# Patient Record
Sex: Female | Born: 1960 | Race: White | Hispanic: No | Marital: Married | Smoking: Never smoker
Health system: Southern US, Community
[De-identification: ages and names within clinical notes are randomized; demographics above are authoritative.]

## PROBLEM LIST (undated history)

## (undated) DIAGNOSIS — R9389 Abnormal findings on diagnostic imaging of other specified body structures: Secondary | ICD-10-CM

## (undated) DIAGNOSIS — K649 Unspecified hemorrhoids: Secondary | ICD-10-CM

## (undated) DIAGNOSIS — Z8669 Personal history of other diseases of the nervous system and sense organs: Secondary | ICD-10-CM

## (undated) DIAGNOSIS — N200 Calculus of kidney: Secondary | ICD-10-CM

## (undated) DIAGNOSIS — F419 Anxiety disorder, unspecified: Secondary | ICD-10-CM

## (undated) DIAGNOSIS — N3010 Interstitial cystitis (chronic) without hematuria: Secondary | ICD-10-CM

## (undated) HISTORY — PX: TUBAL LIGATION: SHX77

## (undated) HISTORY — DX: Unspecified hemorrhoids: K64.9

## (undated) HISTORY — PX: OTHER SURGICAL HISTORY: SHX169

## (undated) HISTORY — DX: Personal history of other diseases of the nervous system and sense organs: Z86.69

## (undated) HISTORY — DX: Calculus of kidney: N20.0

## (undated) HISTORY — DX: Abnormal findings on diagnostic imaging of other specified body structures: R93.89

## (undated) HISTORY — DX: Anxiety disorder, unspecified: F41.9

## (undated) HISTORY — DX: Interstitial cystitis (chronic) without hematuria: N30.10

---

## 1993-07-03 ENCOUNTER — Encounter: Payer: Self-pay | Admitting: Internal Medicine

## 1994-04-15 ENCOUNTER — Encounter: Payer: Self-pay | Admitting: Internal Medicine

## 1998-03-27 ENCOUNTER — Encounter: Payer: Self-pay | Admitting: Internal Medicine

## 1998-03-28 ENCOUNTER — Encounter: Payer: Self-pay | Admitting: Internal Medicine

## 1998-03-29 ENCOUNTER — Encounter: Payer: Self-pay | Admitting: Internal Medicine

## 2002-06-16 ENCOUNTER — Encounter: Payer: Self-pay | Admitting: Internal Medicine

## 2005-12-06 ENCOUNTER — Encounter: Payer: Self-pay | Admitting: Internal Medicine

## 2006-06-20 ENCOUNTER — Emergency Department (HOSPITAL_COMMUNITY): Admission: EM | Admit: 2006-06-20 | Discharge: 2006-06-20 | Payer: Self-pay | Admitting: Emergency Medicine

## 2006-07-14 ENCOUNTER — Encounter: Admission: RE | Admit: 2006-07-14 | Discharge: 2006-07-14 | Payer: Self-pay | Admitting: Obstetrics and Gynecology

## 2006-12-16 ENCOUNTER — Emergency Department (HOSPITAL_COMMUNITY): Admission: EM | Admit: 2006-12-16 | Discharge: 2006-12-16 | Payer: Self-pay | Admitting: Emergency Medicine

## 2006-12-17 ENCOUNTER — Ambulatory Visit: Payer: Self-pay | Admitting: Family Medicine

## 2006-12-17 DIAGNOSIS — N3010 Interstitial cystitis (chronic) without hematuria: Secondary | ICD-10-CM

## 2006-12-17 DIAGNOSIS — Z8669 Personal history of other diseases of the nervous system and sense organs: Secondary | ICD-10-CM

## 2006-12-17 DIAGNOSIS — Z87442 Personal history of urinary calculi: Secondary | ICD-10-CM

## 2006-12-17 HISTORY — DX: Personal history of other diseases of the nervous system and sense organs: Z86.69

## 2006-12-19 ENCOUNTER — Telehealth (INDEPENDENT_AMBULATORY_CARE_PROVIDER_SITE_OTHER): Payer: Self-pay | Admitting: *Deleted

## 2007-05-20 ENCOUNTER — Ambulatory Visit: Payer: Self-pay | Admitting: Family Medicine

## 2007-05-20 ENCOUNTER — Telehealth (INDEPENDENT_AMBULATORY_CARE_PROVIDER_SITE_OTHER): Payer: Self-pay | Admitting: *Deleted

## 2008-09-12 ENCOUNTER — Ambulatory Visit: Payer: Self-pay | Admitting: Internal Medicine

## 2008-09-12 DIAGNOSIS — F419 Anxiety disorder, unspecified: Secondary | ICD-10-CM

## 2008-09-12 DIAGNOSIS — Z780 Asymptomatic menopausal state: Secondary | ICD-10-CM | POA: Insufficient documentation

## 2008-09-12 DIAGNOSIS — G4700 Insomnia, unspecified: Secondary | ICD-10-CM | POA: Insufficient documentation

## 2008-09-15 ENCOUNTER — Telehealth: Payer: Self-pay | Admitting: Internal Medicine

## 2008-09-20 ENCOUNTER — Ambulatory Visit: Payer: Self-pay | Admitting: Internal Medicine

## 2008-09-20 DIAGNOSIS — J069 Acute upper respiratory infection, unspecified: Secondary | ICD-10-CM | POA: Insufficient documentation

## 2008-10-26 ENCOUNTER — Ambulatory Visit: Payer: Self-pay | Admitting: Internal Medicine

## 2008-10-26 ENCOUNTER — Telehealth (INDEPENDENT_AMBULATORY_CARE_PROVIDER_SITE_OTHER): Payer: Self-pay | Admitting: *Deleted

## 2009-02-20 ENCOUNTER — Telehealth (INDEPENDENT_AMBULATORY_CARE_PROVIDER_SITE_OTHER): Payer: Self-pay | Admitting: *Deleted

## 2009-02-21 ENCOUNTER — Encounter: Admission: RE | Admit: 2009-02-21 | Discharge: 2009-02-21 | Payer: Self-pay | Admitting: Obstetrics and Gynecology

## 2009-02-21 ENCOUNTER — Encounter: Payer: Self-pay | Admitting: Internal Medicine

## 2009-02-28 ENCOUNTER — Ambulatory Visit: Payer: Self-pay | Admitting: Internal Medicine

## 2009-02-28 DIAGNOSIS — M549 Dorsalgia, unspecified: Secondary | ICD-10-CM | POA: Insufficient documentation

## 2009-02-28 LAB — CONVERTED CEMR LAB
Glucose, Urine, Semiquant: NEGATIVE
Urobilinogen, UA: 0.2

## 2009-03-02 ENCOUNTER — Encounter: Payer: Self-pay | Admitting: Internal Medicine

## 2009-03-02 LAB — CONVERTED CEMR LAB: RBC / HPF: NONE SEEN (ref <3)

## 2009-03-22 ENCOUNTER — Ambulatory Visit: Payer: Self-pay | Admitting: Internal Medicine

## 2009-06-20 ENCOUNTER — Encounter: Payer: Self-pay | Admitting: Internal Medicine

## 2009-06-20 LAB — CONVERTED CEMR LAB
AST: 19 units/L
BUN: 10 mg/dL
HCT: 38.9 %
Hemoglobin: 13.7 g/dL
Total Bilirubin: 0.5 mg/dL
Total Protein: 6.5 g/dL
platelet count: 175 10*3/uL

## 2009-06-21 ENCOUNTER — Ambulatory Visit: Payer: Self-pay | Admitting: Internal Medicine

## 2009-06-22 ENCOUNTER — Encounter: Payer: Self-pay | Admitting: Internal Medicine

## 2009-07-03 ENCOUNTER — Ambulatory Visit: Payer: Self-pay | Admitting: Internal Medicine

## 2009-07-13 ENCOUNTER — Ambulatory Visit: Payer: Self-pay | Admitting: Family Medicine

## 2009-07-14 ENCOUNTER — Encounter: Payer: Self-pay | Admitting: Family Medicine

## 2009-09-08 ENCOUNTER — Ambulatory Visit: Payer: Self-pay | Admitting: Internal Medicine

## 2009-09-11 ENCOUNTER — Ambulatory Visit: Payer: Self-pay | Admitting: Internal Medicine

## 2009-09-13 LAB — CONVERTED CEMR LAB: TSH: 0.99 microintl units/mL (ref 0.35–5.50)

## 2010-04-04 ENCOUNTER — Telehealth: Payer: Self-pay | Admitting: Internal Medicine

## 2010-05-04 ENCOUNTER — Ambulatory Visit: Payer: Self-pay | Admitting: Internal Medicine

## 2010-05-30 ENCOUNTER — Ambulatory Visit
Admission: RE | Admit: 2010-05-30 | Discharge: 2010-05-30 | Payer: Self-pay | Source: Home / Self Care | Attending: Internal Medicine | Admitting: Internal Medicine

## 2010-05-30 DIAGNOSIS — J329 Chronic sinusitis, unspecified: Secondary | ICD-10-CM | POA: Insufficient documentation

## 2010-05-30 LAB — CONVERTED CEMR LAB
Blood in Urine, dipstick: NEGATIVE
Nitrite: NEGATIVE
Specific Gravity, Urine: 1.015
WBC Urine, dipstick: NEGATIVE
pH: 7

## 2010-06-26 NOTE — Assessment & Plan Note (Signed)
Summary: sore throat and congestion//lch strep test//lch   Vital Signs:  Patient profile:   50 year old female Height:      67 inches Weight:      150.2 pounds BMI:     23.61 Pulse rate:   80 / minute BP sitting:   112 / 64  Vitals Entered By: rachel peeler CC: sore throat, head congestion, cough-yellow mucous, URI symptoms Comments "impossible to swallow" since tues AM, took tylenol and mucinexd today   History of Present Illness:       This is a 50 year old woman who presents with URI symptoms.  The symptoms began 2 days ago.  The severity is described as severe.  The patient complains of sore throat and dry cough, but denies nasal congestion, clear nasal discharge, purulent nasal discharge, productive cough, earache, and sick contacts.  The patient denies fever, low-grade fever (<100.5 degrees), fever of 100.5-103 degrees, fever of 103.1-104 degrees, fever to >104 degrees, stiff neck, dyspnea, wheezing, rash, vomiting, diarrhea, use of an antipyretic, and response to antipyretic.  The patient denies itchy watery eyes, itchy throat, sneezing, seasonal symptoms, response to antihistamine, headache, muscle aches, and severe fatigue.  Risk factors for Strep sinusitis include Strep exposure.  The patient denies the following risk factors for Strep sinusitis: unilateral facial pain, unilateral nasal discharge, poor response to decongestant, double sickening, tooth pain, tender adenopathy, and absence of cough.    Current Medications (verified): 1)  Ambien 10 Mg Tabs (Zolpidem Tartrate) .... One By Mouth At Bedtime As Needed 2)  Lexapro 10 Mg Tabs (Escitalopram Oxalate) .... Qd 3)  Epipen 2-Pak 0.3 Mg/0.33ml Devi (Epinephrine) .... As Directed 4)  Hrt Prescribed By The Gynecologist 5)  Epipen 2-Pak 0.3 Mg/0.35ml Devi (Epinephrine) .... Use One Injection As Needed For A Bee Sting, Then Proceed To The Er 6)  Ery-Tab 500 Mg Tbec (Erythromycin Base) .Marland Kitchen.. 1 By Mouth Two Times A Day 7)  Prednisone 10  Mg Tabs (Prednisone) .... 3 By Mouth Once Daily For 3 Days Then 2 By Mouth Once Daily For 3 Days Then 1 By Mouth Once Daily For 3 Days  Allergies (verified): 1)  ! Penicillin V Potassium (Penicillin V Potassium) 2)  ! * Bee Sting  Past History:  Past medical, surgical, family and social histories (including risk factors) reviewed for relevance to current acute and chronic problems.  Past Medical History: Reviewed history from 09/12/2008 and no changes required. Nephrolithiasis, hx of interstitial cystitits  Anxiety  Past Surgical History: Reviewed history from 09/12/2008 and no changes required. Kidney stone extraction Essure for birth Control endometrial ablation  Family History: Reviewed history from 09/12/2008 and no changes required. Lung cancer-- F DM-- GF MI-- F  Social History: Reviewed history from 09/12/2008 and no changes required. Married 1 child  moved to [place] 2008 from [place] [facility] Never Smoked Alcohol use-no Regular exercise-yes Drug use-no Occupation: Runner, broadcasting/film/video  Review of Systems      See HPI  Physical Exam  General:  Well-developed,well-nourished,in no acute distress; alert,appropriate and cooperative throughout examination Mouth:  pharyngeal erythema and pharyngeal exudate.   Neck:  cervical lymphadenopathy.   Lungs:  Normal respiratory effort, chest expands symmetrically. Lungs are clear to auscultation, no crackles or wheezes. Heart:  Normal rate and regular rhythm. S1 and S2 normal without gallop, murmur, click, rub or other extra sounds. Skin:  Intact without suspicious lesions or rashes Psych:  Oriented X3 and good eye contact.     Impression & Recommendations:  Problem # 1:  ACUTE PHARYNGITIS (ICD-462)  Her updated medication list for this problem includes:    Ery-tab 500 Mg Tbec (Erythromycin base) .Marland Kitchen... 1 by mouth two times a day    prednisone taper   Orders: T-Culture, Throat (29528-41324) Rapid Strep (40102) Admin of Therapeutic Inj   intramuscular or subcutaneous (72536) Depo- Medrol 80mg  (J1040)  Instructed to complete antibiotics and call if not improved in 48 hours.   Complete Medication List: 1)  Ambien 10 Mg Tabs (Zolpidem tartrate) .... One by mouth at bedtime as needed 2)  Lexapro 10 Mg Tabs (Escitalopram oxalate) .... Qd 3)  Epipen 2-pak 0.3 Mg/0.78ml Devi (Epinephrine) .... As directed 4)  Hrt Prescribed By The Gynecologist  5)  Epipen 2-pak 0.3 Mg/0.14ml Devi (Epinephrine) .... Use one injection as needed for a bee sting, then proceed to the er 6)  Ery-tab 500 Mg Tbec (Erythromycin base) .Marland Kitchen.. 1 by mouth two times a day 7)  Prednisone 10 Mg Tabs (Prednisone) .... 3 by mouth once daily for 3 days then 2 by mouth once daily for 3 days then 1 by mouth once daily for 3 days Prescriptions: PREDNISONE 10 MG TABS (PREDNISONE) 3 by mouth once daily for 3 days then 2 by mouth once daily for 3 days then 1 by mouth once daily for 3 days  #18 x 0   Entered and Authorized by:   Loreen Freud DO   Signed by:   Loreen Freud DO on 07/13/2009   Method used:   [facility] to        [facility] [phone]* (retail)       [address]       [place], [place]  [address]       Ph: [phone]       Fax: [phone]   RxID:   [id number] ERY-TAB 500 MG TBEC (ERYTHROMYCIN BASE) 1 by mouth two times a day  #20 x 0   Entered and Authorized by:   Loreen Freud DO   Signed by:   Loreen Freud DO on 07/13/2009   Method used:   [facility]ronically to        [facility] [phone]* (retail)       [address]       [place], [place]  [address]       Ph: [phone]       Fax: [phone]   RxID:   [id number]    Medication Administration  Injection # 1:    Medication: Depo- Medrol 80mg     Diagnosis: ACUTE PHARYNGITIS (ICD-462)    Route: IM    Site: RUOQ gluteus    Exp Date: 07/2010    Lot #: [id number]    Mfr: Pharmacia    Patient tolerated injection without  complications    Given by: Army Fossa CMA (July 13, 2009 4:07 PM)  Orders Added: 1)  T-Culture, Throat [[id number]] 2)  Est. Patient Level III [69678] 3)  Rapid Strep [93810] 4)  Admin of Therapeutic Inj  intramuscular or subcutaneous [96372] 5)  Depo- Medrol 80mg  [J1040]

## 2010-06-26 NOTE — Assessment & Plan Note (Signed)
Summary: 10 day fu/ns/kdc   Vital Signs:  Patient profile:   50 year old female Height:      67 inches Weight:      148.2 pounds BMI:     23.30 Pulse rate:   70 / minute BP sitting:   110 / 60  Vitals Entered By: Shary Decamp (July 03, 2009 4:16 PM) CC: rov - feeling much better   History of Present Illness: follow-up from last office visit feeling well abdominal pain resolved within a few days she's not on Nexium anymore denies nausea, vomiting, diarrhea   Current Medications (verified): 1)  Ambien 10 Mg Tabs (Zolpidem Tartrate) .... One By Mouth At Bedtime As Needed 2)  Lexapro 10 Mg Tabs (Escitalopram Oxalate) .... Qd 3)  Epipen 2-Pak 0.3 Mg/0.30ml Devi (Epinephrine) .... As Directed 4)  Hrt Prescribed By The Gynecologist 5)  Epipen 2-Pak 0.3 Mg/0.36ml Devi (Epinephrine) .... Use One Injection As Needed For A Bee Sting, Then Proceed To The Er  Allergies (verified): 1)  ! Penicillin V Potassium (Penicillin V Potassium) 2)  ! * Bee Sting  Past History:  Past Medical History: Reviewed history from 09/12/2008 and no changes required. Nephrolithiasis, hx of interstitial cystitits  Anxiety  Past Surgical History: Reviewed history from 09/12/2008 and no changes required. Kidney stone extraction Essure for birth Control endometrial ablation  Physical Exam  General:  alert and well-developed.   Abdomen:  soft, non-tender, no distention, no masses, no guarding, and no rigidity.     Impression & Recommendations:  Problem # 1:  ABDOMINAL PAIN (ICD-789.00) resolved did she pass a gallbladder stone?   Plan: Patient will call if symptoms resurface  Complete Medication List: 1)  Ambien 10 Mg Tabs (Zolpidem tartrate) .... One by mouth at bedtime as needed 2)  Lexapro 10 Mg Tabs (Escitalopram oxalate) .... Qd 3)  Epipen 2-pak 0.3 Mg/0.10ml Devi (Epinephrine) .... As directed 4)  Hrt Prescribed By The Gynecologist  5)  Epipen 2-pak 0.3 Mg/0.67ml Devi (Epinephrine)  .... Use one injection as needed for a bee sting, then proceed to the er

## 2010-06-26 NOTE — Miscellaneous (Signed)
Summary: RESULTS FROM [facility]  Clinical Lists Changes  Observations: Added new observation of EKG INTERP: normal:  done @ [facility] (06/20/2009 11:46) Added new observation of PROTEIN, TOT: 6.5 g/dL (16/02/9603 54:09) Added new observation of ALBUMIN: 3.7 g/dL (81/19/1478 29:56) Added new observation of BILI TOTAL: 0.5 mg/dL (21/30/8657 84:69) Added new observation of ALK PHOS: 71 units/L (06/20/2009 11:46) Added new observation of SGPT (ALT): 11 units/L (06/20/2009 11:46) Added new observation of SGOT (AST): 19 units/L (06/20/2009 11:46) Added new observation of BG RANDOM: 104 mg/dL (62/95/2841 32:44) Added new observation of CREATININE: 0.67 mg/dL (05/29/7251 66:44) Added new observation of BUN: 10 mg/dL (03/47/4259 56:38) Added new observation of CO2 TOTAL: 26 mmol/L (06/20/2009 11:46) Added new observation of CHLORIDE: 105 mmol/L (06/20/2009 11:46) Added new observation of POTASSIUM: 3.6 mmol/L (06/20/2009 11:46) Added new observation of SODIUM: 139 mmol/L (06/20/2009 11:46) Added new observation of PLATELET CNT: 175 10*3/microliter (06/20/2009 11:46) Added new observation of HCT: 38.9 % (06/20/2009 11:46) Added new observation of HGB: 13.7 g/dL (75/64/3329 51:88) Added new observation of RBC: 4.26 10*6/mm3 (06/20/2009 11:46) Added new observation of WBC: 5.3 10*3/mm3 (06/20/2009 11:46) Added new observation of [place] GALL/LIV: normal:  done [facility]  (06/20/2009 11:46) Added new observation of CTABDPELVIS: normal:  done @ [facility]  (06/20/2009 11:46)      CT Abdomen/Pelvis  Procedure date:  06/20/2009  Findings:      normal:  done @ [facility]  [place] of gallbladder-liver  Procedure date:  06/20/2009  Findings:      normal:  done @ [facility]  EKG  Procedure date:  06/20/2009  Findings:      normal:  done @ [facility]   CT Abdomen/Pelvis  Procedure date:  06/20/2009  Findings:      normal:  done @ [facility]  [place] of gallbladder-liver  Procedure date:   06/20/2009  Findings:      normal:  done @ [facility]  EKG  Procedure date:  06/20/2009  Findings:      normal:  done @ [facility]    Complete Blood Count Test Date: 06/20/2009             Value   Units      H/L    Reference  WBC:       5.3   X 10^3/uL          (3.5-10.0) RBC:       4.26  X 10^6/uL          (3.60-5.00) Hgb:       13.7  g/dl               (41.6-60.6) Hct:       38.9  %                  (35.0-46.0) Platelets: 175   X 10^3/uL          (150-450)  Test performed by:  [facility]    Chemistry Labs Test Date: 06/20/2009                      Value Units        H/L   Reference  Sodium:             139   mmol/L             (137-145) Potassium:  3.6   mmol/L             (3.6-5.0) Chloride:           105   mmol/L             (101-111) CO2:                26    mmol/L             (22-31) BUN:                10    mg/dL              (1-61) Creatinine:         0.67  mg/dL              (0.9-6.0) Glucose-random:     104   mg/dL              (45-409) SGOT:               19    U/L                (10-40) SGPT:               11    U/L                (10-40) Alkaline P'tase:    71    U/L                (10-120) T. Bili:            0.5   mg/dL              (8.1-1.9) Albumin:            3.7   g/dL               (3-5) Total Protein:      6.5   g/dL               (4-7)  Test performed by:  [facility]    Lab Entry Test Date: 06/20/2009                        Value        Units        H/L   Reference  Lipase (ser):         25           U/L                (7-60)  Test performed by:    [facility]

## 2010-06-26 NOTE — Letter (Signed)
Summary: Notes Dated 03-08-93 thru 12-06-05/[facility]  Notes Dated 03-08-93 thru 12-06-05/[facility]   Imported By: Lanelle Bal 06/29/2009 14:19:09  _____________________________________________________________________  External Attachment:    Type:   Image     Comment:   External Document  Appended Document: Notes Dated 03-08-93 thru 12-06-05/[facility] scanned for future reference

## 2010-06-26 NOTE — Miscellaneous (Signed)
Summary: several years old  records  these are several years old records [name] MD  June 23, 2009 8:14 AM Clinical Lists Changes  Observations: Added new observation of ETTECHOCOMM: 1. resting ECG was abnl 2. excerise tolerance was excellent 3. hemodynamic response was adequate 4. exercise induced no cardiac sxs 5. stress ecg findings were non-diagnostic 6. stress myocadial perfusion imaging sonsistent with normal perfusion (06/16/2002 13:09) Added new observation of ETTECHOFIND: Normal-no evidence of ischemia.   (06/16/2002 13:09) Added new observation of ABDOM US: normal:   (03/27/1998 13:10)      Stress Echocardiogram  Procedure date:  06/16/2002  Findings:      Normal-no evidence of ischemia.    Comments:      1. resting ECG was abnl 2. excerise tolerance was excellent 3. hemodynamic response was adequate 4. exercise induced no cardiac sxs 5. stress ecg findings were non-diagnostic 6. stress myocadial perfusion imaging sonsistent with normal perfusion  [place] of Abdomen  Procedure date:  03/27/1998  Findings:      normal:     Stress Echocardiogram  Procedure date:  06/16/2002  Findings:      Normal-no evidence of ischemia.    Comments:      1. resting ECG was abnl 2. excerise tolerance was excellent 3. hemodynamic response was adequate 4. exercise induced no cardiac sxs 5. stress ecg findings were non-diagnostic 6. stress myocadial perfusion imaging sonsistent with normal perfusion  [place] of Abdomen  Procedure date:  03/27/1998  Findings:      normal:

## 2010-06-26 NOTE — Progress Notes (Signed)
Summary: Refill Request  Phone Note Refill Request Call back at [phone] Message from:  Pharmacy on April 04, 2010 10:38 AM  Refills Requested: Medication #1:  AMBIEN 10 MG TABS one by mouth at bedtime as needed   Dosage confirmed as above?Dosage Confirmed   Brand Name Necessary? No   Supply Requested: 3 months   Last Refilled: 11/03/2008   Notes[facility]  Next Appointment Scheduled: none Initial call taken by: Harold Barban,  April 04, 2010 10:38 AM  Follow-up for Phone Call        ok 90 and 1 RF [name] MD  April 04, 2010 1:41 PM     Prescriptions: AMBIEN 10 MG TABS (ZOLPIDEM TARTRATE) one by mouth at bedtime as needed  #90 x 1   Entered by:   Army Fossa CMA   Authorized by:   Nolon Rod. Paz MD   Signed by:   Army Fossa CMA on 04/04/2010   Method used:   Printed then faxed to ...       [facility] [phone]* (retail)       [address]       [place], [place]  [address]       Ph: [phone]       Fax: [phone]   RxID:   [id number]

## 2010-06-26 NOTE — Assessment & Plan Note (Signed)
Summary: 6 MTH FU/KDC//pt rsh//lch   Vital Signs:  Patient profile:   50 year old female Height:      67 inches Weight:      149 pounds BMI:     23.42 Pulse rate:   72 / minute BP sitting:   112 / 60  Vitals Entered By: Shary Decamp (September 08, 2009 4:29 PM) CC: rov   History of Present Illness: ROV to discuss the need to continue Lexapro she is feeling well still has a lot of stress in her life related to her husband's new job and  her own job  Current Medications (verified): 1)  Ambien 10 Mg Tabs (Zolpidem Tartrate) .... One By Mouth At Bedtime As Needed 2)  Lexapro 10 Mg Tabs (Escitalopram Oxalate) .... Qd 3)  Epipen 2-Pak 0.3 Mg/0.94ml Devi (Epinephrine) .... As Directed 4)  Hrt Prescribed By The Gynecologist 5)  Epipen 2-Pak 0.3 Mg/0.53ml Devi (Epinephrine) .... Use One Injection As Needed For A Bee Sting, Then Proceed To The Er 6)  Prometrium 7)  Estrodial  Allergies (verified): 1)  ! Penicillin V Potassium (Penicillin V Potassium) 2)  ! * Bee Sting  Past History:  Past Medical History: Nephrolithiasis, hx of interstitial cystitits  Anxiety  Social History: Married 1 child  moved to [place] 2008 from [facility] Never Smoked Alcohol use-no Regular exercise-yes Drug use-no Occupation: Runner, broadcasting/film/video, private school  Review of Systems       the only new development is that she started to experiencing  severe hot flashes for the last two weeks, menopausal symptoms were previously well controlled on HRT and SSRIs denies fever, chest pain, shortness of breath she was also seen here several months ago after a  ER visit due to abdominal pain.  No further symptoms  Physical Exam  General:  alert and well-developed.   Lungs:  normal respiratory effort, no intercostal retractions, no accessory muscle use, and normal breath sounds.     Impression & Recommendations:  Problem # 1:  ANXIETY (ICD-300.00) overall well controlled from the anxiety standpoint, she probably could be  able to discontinue Lexapro however I suspect the Lexapro is also helping her menopausal symptoms; since  her menopausal symptoms are slightly worse lately, I don't think this is a right time to D/C Lexapro plan: Continue Lexapro, reassess in 6 months  Her updated medication list for this problem includes:    Lexapro 10 Mg Tabs (Escitalopram oxalate) ..... Qd  Problem # 2:  HOT FLASHES (ICD-627.2) increased for the last two weeks recommend to call gynecology, increase HRT doses?  Complete Medication List: 1)  Ambien 10 Mg Tabs (Zolpidem tartrate) .... One by mouth at bedtime as needed 2)  Lexapro 10 Mg Tabs (Escitalopram oxalate) .... Qd 3)  Epipen 2-pak 0.3 Mg/0.25ml Devi (Epinephrine) .... As directed 4)  Hrt Prescribed By The Gynecologist  5)  Epipen 2-pak 0.3 Mg/0.59ml Devi (Epinephrine) .... Use one injection as needed for a bee sting, then proceed to the er 6)  Prometrium  7)  Estrodial   Patient Instructions: 1)  please check a TSH next week  2)  dx 300.00  3)  Please schedule a follow-up appointment in 6 months .

## 2010-06-26 NOTE — Assessment & Plan Note (Signed)
Summary: er/fu/kdc   Vital Signs:  Patient profile:   50 year old female Height:      67 inches Weight:      150.4 pounds Temp:     98.5 degrees F BP sitting:   110 / 80  Vitals Entered By: Shary Decamp (June 21, 2009 3:48 PM) CC: F/U FROM ED VISIT YESTERDAY ([facility]) Comments  - went to ED with back, abd pain  - CT, EKG, u/s, labs neg  - was rx'd flexeril, motrin, vicodin 5/500 Shary Decamp  June 21, 2009 3:49 PM    History of Present Illness: sudden onset of intense epigastric pain yesterday two hours after a light breakfast pain is described  as a punch, it  definitely radiated straight to her back. Went to the ER and reportedly all   blood work and x-rays were negative. was rx'd flexeril, motrin, vicodin 5/500 her pain is different from previous problems with urolithiasis  knee, the abdominal pain is essentially resolved, she has been left with moderate back soreness.  This pain is worse whenever she bends or twists her torso. Is not recall any back injury recently except for the fact that she had to "bend funny" yesterday to get to her son's sport car  Current Medications (verified): 1)  Ambien 10 Mg Tabs (Zolpidem Tartrate) .... One By Mouth At Bedtime As Needed 2)  Lexapro 10 Mg Tabs (Escitalopram Oxalate) .... Qd 3)  Epipen 2-Pak 0.3 Mg/0.13ml Devi (Epinephrine) .... As Directed 4)  Hrt Prescribed By The Gynecologist 5)  Epipen 2-Pak 0.3 Mg/0.29ml Devi (Epinephrine) .... Use One Injection As Needed For A Bee Sting, Then Proceed To The Er  Allergies (verified): 1)  ! Penicillin V Potassium (Penicillin V Potassium) 2)  ! * Bee Sting  Past History:  Past Medical History: Reviewed history from 09/12/2008 and no changes required. Nephrolithiasis, hx of interstitial cystitits  Anxiety  Past Surgical History: Reviewed history from 09/12/2008 and no changes required. Kidney stone extraction Essure for birth Control endometrial ablation  Social  History: Reviewed history from 09/12/2008 and no changes required. Married 1 child  moved to [place] 2008 from [facility] Never Smoked Alcohol use-no Regular exercise-yes Drug use-no Occupation: Runner, broadcasting/film/video  Review of Systems       denies any fever nausea, vomiting x1  yesterday, no hematemesis no  diarrhea, blood in the stools, no recent heartburn no recent intake of over-the-counter NSAIDs chronic dysuria at baseline, no hematuria no rash in the back  Physical Exam  General:  alert, well-developed, and well-nourished.   Eyes:  not pale,  icteric Lungs:  normal respiratory effort, no intercostal retractions, no accessory muscle use, and normal breath sounds.   Heart:  normal rate, regular rhythm, no murmur, and no gallop.   Abdomen:  soft, no distention, and no masses.  generalized mild abdominal  discomfort, the patient become quite nauseous and vomited after my exam Msk:  has generalize dyscomfort  to palpation at the CVA bilaterally but no tenderness over the spine per se   Impression & Recommendations:  Problem # 1:  BACK PAIN (ICD-724.5) abdomen and back pain, etiology unclear PUD? musculoskeletal back pain? Dyskinetic gallbladder? Plan: avoid  Motrin samples of nexium continue with Vicodin get results from the ER call immediately if symptoms increase reassess in 10 days  Problem # 2:  addendum   records from the ER: CBC showed a norma WBC  lipase normal, LFTs normal, UA essentially unremarkable, noncontrast CT of the abdomen  and is normal, Right upper quadrant ultrasound normal plan: The same  Problem # 3:  ABDOMINAL PAIN (ICD-789.00) see #1  Complete Medication List: 1)  Ambien 10 Mg Tabs (Zolpidem tartrate) .... One by mouth at bedtime as needed 2)  Lexapro 10 Mg Tabs (Escitalopram oxalate) .... Qd 3)  Epipen 2-pak 0.3 Mg/0.53ml Devi (Epinephrine) .... As directed 4)  Hrt Prescribed By The Gynecologist  5)  Epipen 2-pak 0.3 Mg/0.44ml Devi (Epinephrine) .... Use one  injection as needed for a bee sting, then proceed to the er  Patient Instructions: 1)  rest, flexeril, vicodin 2)  avoid motrin, advil 3)  nexium two times a day on a empty stomach 4)  came back in 10 days 5)  call any timeif symptoms severe (or go to the ER)

## 2010-06-28 NOTE — Assessment & Plan Note (Signed)
Summary: cough - congested/cbs   Vital Signs:  Patient profile:   50 year old female Weight:      153.25 pounds O2 Sat:      98 % on Room air Temp:     98.8 degrees F oral Pulse rate:   94 / minute Pulse rhythm:   regular BP sitting:   132 / 82  (left arm) Cuff size:   large  Vitals Entered By: Army Fossa CMA (May 04, 2010 4:14 PM)  O2 Flow:  Room air CC: Pt here chest congestion- started monday Comments tried mucinex  [facility] [place]   History of Present Illness: symptoms started 4 days ago  Sinus congestion, teeth were hurting, then she developed sore throat and chest congestion. She's coughing up some discolored phlegm. Teeth pain has decreased husband is sick with similar symptoms  Current Medications (verified): 1)  Ambien 10 Mg Tabs (Zolpidem Tartrate) .... One By Mouth At Bedtime As Needed 2)  Lexapro 10 Mg Tabs (Escitalopram Oxalate) .... Qd 3)  Epipen 2-Pak 0.3 Mg/0.51ml Devi (Epinephrine) .... As Directed 4)  Epipen 2-Pak 0.3 Mg/0.29ml Devi (Epinephrine) .... Use One Injection As Needed For A Bee Sting, Then Proceed To The Er 5)  Prometrium 6)  Estrodial  Allergies (verified): 1)  ! Penicillin V Potassium (Penicillin V Potassium) 2)  ! * Bee Sting  Past History:  Past Medical History: Reviewed history from 09/08/2009 and no changes required. Nephrolithiasis, hx of interstitial cystitits  Anxiety  Past Surgical History: Reviewed history from 09/12/2008 and no changes required. Kidney stone extraction Essure for birth Control endometrial ablation  Social History: Married 1 child  moved to [place] 2008 from [facility] Never Smoked Alcohol use-no Regular exercise-yes Drug use-no Occupation: Runner, broadcasting/film/video, private school, 5th grade  Review of Systems General:  Complains of fatigue; (+) subjective fever . ENT:  had HA 2 days ago. Resp:  Denies coughing up blood. GI:  Denies diarrhea and vomiting; some nausea . MS:  some myalhias . Derm:  Denies  rash.  Physical Exam  General:  alert and well-developed.  nontoxic Head:  face symmetric Mild symmetric tenderness in the maxillary sinuses Ears:  R ear normal and L ear normal.   Nose:  slightly congested Mouth:  no redness or discharge Lungs:  normal respiratory effort, no intercostal retractions, no accessory muscle use, and normal breath sounds.   Heart:  normal rate, regular rhythm, and no murmur.     Impression & Recommendations:  Problem # 1:  URI (ICD-465.9) Assessment New presents with URI symptoms, some tenderness in the maxillary sinuses. see  instructions  Complete Medication List: 1)  Ambien 10 Mg Tabs (Zolpidem tartrate) .... One by mouth at bedtime as needed 2)  Lexapro 10 Mg Tabs (Escitalopram oxalate) .... Qd 3)  Epipen 2-pak 0.3 Mg/0.16ml Devi (Epinephrine) .... As directed 4)  Epipen 2-pak 0.3 Mg/0.40ml Devi (Epinephrine) .... Use one injection as needed for a bee sting, then proceed to the er 5)  Prometrium  6)  Estrodial  7)  Zithromax Z-pak 250 Mg Tabs (Azithromycin) .... As directed  Patient Instructions: 1)  rest, fluids , tylenol 2)  mucinex DM as needed 3)  sudafed 30mg   (behind the counter ) 1 every 6 hours as needed for congestion 4)  if no better in few days , start a Zpack 5)  call if you get worse or if no better next week  Prescriptions: ZITHROMAX Z-PAK 250 MG TABS (AZITHROMYCIN) as directed  #1  x 0   Entered and Authorized by:   Nolon Rod. Areej Tayler MD   Signed by:   Nolon Rod. Zayne Draheim MD on 05/04/2010   Method used:   Print then Give to Patient   RxID:   [id number]    Orders Added: 1)  Est. Patient Level III [65784]

## 2010-06-28 NOTE — Assessment & Plan Note (Signed)
Summary: sore throat//congestion//lch   Vital Signs:  Patient profile:   50 year old female Weight:      152.25 pounds Temp:     98.4 degrees F oral Pulse rate:   82 / minute Pulse rhythm:   regular BP sitting:   117 / 70  (left arm) Cuff size:   large  Vitals Entered By: Army Fossa CMA (May 30, 2010 4:03 PM) CC: Pt here c/o Chest Congestion, HA, Sinus Congestion, Ear Pain,Fatiuged, urinating more. Comments [facility] [address] [address]   History of Present Illness: she was seen 05-04-10 with a URI, few days later she started a Z-Pak. She actually never got 100% better In the last few days the symptoms have increased. She has persistent cough  (although slightly better than initially) Continue with sinus and ear congestion Now also has chest congestion  ROS No fevers Continue with sore throat No nausea - vomiting No wheezing  Current Medications (verified): 1)  Ambien 10 Mg Tabs (Zolpidem Tartrate) .... One By Mouth At Bedtime As Needed 2)  Lexapro 10 Mg Tabs (Escitalopram Oxalate) .... Qd 3)  Epipen 2-Pak 0.3 Mg/0.32ml Devi (Epinephrine) .... As Directed 4)  Epipen 2-Pak 0.3 Mg/0.75ml Devi (Epinephrine) .... Use One Injection As Needed For A Bee Sting, Then Proceed To The Er 5)  Prometrium 6)  Estrodial  Allergies (verified): 1)  ! Penicillin V Potassium (Penicillin V Potassium) 2)  ! * Bee Sting  Past History:  Past Medical History: Reviewed history from 09/08/2009 and no changes required. Nephrolithiasis, hx of interstitial cystitits  Anxiety  Past Surgical History: Reviewed history from 09/12/2008 and no changes required. Kidney stone extraction Essure for birth Control endometrial ablation  Physical Exam  General:  alert and well-developed.  no acutely ill Head:  face symmetric, slightly tender at the bilateral maxillary sinuses. Ears:  R ear normal and L ear normal.   Nose:  slightly congested Mouth:  no redness or discharge Lungs:  normal respiratory  effort, no intercostal retractions, no accessory muscle use, and normal breath sounds.   Heart:  normal rate, regular rhythm, and no murmur.     Impression & Recommendations:  Problem # 1:  SINUSITIS (ICD-473.9) ongoing symptoms for  ~ 3 weeks. she has sinus congestion and now some tenderness in the cheeks ----> Suspect sinusitis She is allergic to penicillin, will prescribe Bactrim. see  instructions If no better, consider ENT referral versus a CT of the sinuses Her updated medication list for this problem includes:    Bactrim Ds 800-160 Mg Tabs (Sulfamethoxazole-trimethoprim) .Marland Kitchen... 1 by mouth two times a day x 10 days    Flonase 50 Mcg/act Susp (Fluticasone propionate) .Marland Kitchen... 2 sprays on each side of the nose once daily  Complete Medication List: 1)  Ambien 10 Mg Tabs (Zolpidem tartrate) .... One by mouth at bedtime as needed 2)  Lexapro 10 Mg Tabs (Escitalopram oxalate) .... Qd 3)  Epipen 2-pak 0.3 Mg/0.26ml Devi (Epinephrine) .... As directed 4)  Epipen 2-pak 0.3 Mg/0.35ml Devi (Epinephrine) .... Use one injection as needed for a bee sting, then proceed to the er 5)  Prometrium  6)  Estrodial  7)  Bactrim Ds 800-160 Mg Tabs (Sulfamethoxazole-trimethoprim) .Marland Kitchen.. 1 by mouth two times a day x 10 days 8)  Prednisone 10 Mg Tabs (Prednisone) .... 4 by mouth once daily x 2, 3x2,2x2,1x2 9)  Flonase 50 Mcg/act Susp (Fluticasone propionate) .... 2 sprays on each side of the nose once daily  Other Orders: UA  Dipstick w/o Micro (automated)  (81003)  Patient Instructions: 1)  mucinex DM two times a day x 1 week, then as needed  2)  Bactrim, antibiotic  1 by mouth two times a day x 10 days 3)  Prednisone 10 Mg Tabs :  4 by mouth once daily x 2, 3x2,2x2,1x2 4)  Flonase : 2 sprays on each side of the nose once daily 5)  call if no better in 2 weeks  Prescriptions: FLONASE 50 MCG/ACT SUSP (FLUTICASONE PROPIONATE) 2 sprays on each side of the nose once daily  #1 x 1   Entered and Authorized by:    Elita Quick E. Khambrel Amsden MD   Signed by:   Nolon Rod. Shyvonne Chastang MD on 05/30/2010   Method used:   [facility]        [phone][facility] (retail[facility]       [address]       [place], [place]  [address]       Ph: [phone]       Fax: [phone]   RxID:   [id number] PREDNISONE 10 MG TABS (PREDNISONE) 4 by mouth once daily x 2, 3x2,2x2,1x2  #20 x 0   Entered and Authorized by:   Nolon Rod. Meko Masterson MD   Signed by:   Nolon Rod. Olesya Wike MD on 05/30/2010   Method used:   [facility] to        [facility] [phone][facility] (retail)       [address]       [place], [place]  [address]       Ph: [phone]       Fax: [phone]   RxID:   [id number] BACTRIM DS 800-160 MG TABS (SULFAMETHOXAZOLE-TRIMETHOPRIM) 1 by mouth two times a day x 10 days  #20 x 0   Entered and Authorized by:   Nolon Rod. Ariella Voit MD   Signed by:   Nolon Rod. Naeem Quillin MD on 05/30/2010   Method used:   [facility]ically to        [facility] [phone]* (retail)       [address]       [place], [place]  [address]       Ph: [phone]       Fax: [phone]   RxID:   [id number]    Orders Added: 1)  UA Dipstick w/o Micro (automated)  [81003] 2)  Est. Patient Level III [78938]    Laboratory Results   Urine Tests    Routine Urinalysis   Color: yellow Appearance: Clear Glucose: negative   (Normal Range: Negative) Bilirubin: negative   (Normal Range: Negative) Ketone: negative   (Normal Range: Negative) Spec. Gravity: 1.015   (Normal Range: 1.003-1.035) Blood: negative   (Normal Range: Negative) pH: 7.0   (Normal Range: 5.0-8.0) Protein: negative   (Normal Range: Negative) Urobilinogen: 0.2   (Normal Range: 0-1) Nitrite: negative   (Normal Range: Negative) Leukocyte Esterace: negative   (Normal Range: Negative)    Comments: Army Fossa CMA  May 30, 2010 4:08 PM

## 2010-11-06 ENCOUNTER — Ambulatory Visit (INDEPENDENT_AMBULATORY_CARE_PROVIDER_SITE_OTHER): Payer: BC Managed Care – PPO | Admitting: Internal Medicine

## 2010-11-06 ENCOUNTER — Encounter: Payer: Self-pay | Admitting: Internal Medicine

## 2010-11-06 DIAGNOSIS — F411 Generalized anxiety disorder: Secondary | ICD-10-CM

## 2010-11-06 NOTE — Progress Notes (Signed)
  Subjective:    Patient ID: Annette Woods, female    DOB: Sep 15, 1960, 50 y.o.   MRN: [id number]  HPI Likes to discuss discontinuation of Lexapro  Past Medical History  Diagnosis Date  . Nephrolithiasis   . Interstitial cystitis   . Anxiety    Past Surgical History  Procedure Date  . Kidney stone extraction   . Endometiral ablation      Review of Systems Doing well, currently not having any issues with anxiety, depression or even insomnia. Is not taking Ambien. On looking back, she has taken Lexapro for about 2 years, initially anxiety was triggered by her menopause and the fact that her husband was not working. Currently on HRT and doing well.     Objective:   Physical Exam Alert, oriented x3. No apparent distress. No anxious or depressed appearing.       Assessment & Plan:  Today , I spent more than 15  min with the patient, >50% of the time counseling, and reviewing the chart

## 2010-11-06 NOTE — Assessment & Plan Note (Signed)
Has used Lexapro for almost 2 years, initial triggers of anxiety were menopause and the fact that her husband was not working. Currently menopause is better, on HRT as prescribed by gynecology. Her husband is back working. I think his appropriate to discontinued Lexapro, we'll do it slowly, see instructions. Patient could go back on Lexapro if needed. Will call if problems

## 2010-11-06 NOTE — Patient Instructions (Signed)
Lexapro 10 mg: 1/2 tablet a day x 1 month Then 1/2 tablet every other day x 1 month, then stop

## 2010-11-08 ENCOUNTER — Other Ambulatory Visit: Payer: Self-pay | Admitting: *Deleted

## 2010-11-08 MED ORDER — ESCITALOPRAM OXALATE 10 MG PO TABS: 10.0000 mg | ORAL_TABLET | ORAL | Status: DC

## 2011-01-21 ENCOUNTER — Telehealth: Payer: Self-pay | Admitting: *Deleted

## 2011-01-21 NOTE — Telephone Encounter (Signed)
Pt says that she had recently stopped Lexapro and now would like to start back on medication says that her hot flashes have multiplied since stopping and would like to be restarted. Pls advise.

## 2011-01-22 MED ORDER — ESCITALOPRAM OXALATE 10 MG PO TABS: 10.0000 mg | ORAL_TABLET | ORAL | Status: DC

## 2011-01-22 NOTE — Telephone Encounter (Signed)
Left detailed msg on voicemail that rx sent to pharmacy

## 2011-01-22 NOTE — Telephone Encounter (Signed)
Okay to go back to Lexapro 10 mg one by mouth daily, call a prescription, #30 and 6 rf

## 2011-03-11 LAB — URINALYSIS, ROUTINE W REFLEX MICROSCOPIC
Bilirubin Urine: NEGATIVE
Glucose, UA: NEGATIVE
Hgb urine dipstick: NEGATIVE
Ketones, ur: NEGATIVE
Nitrite: NEGATIVE
Protein, ur: NEGATIVE
Specific Gravity, Urine: 1.023
Urobilinogen, UA: 1
pH: 6

## 2011-03-11 LAB — COMPREHENSIVE METABOLIC PANEL
ALT: 13
AST: 17
Albumin: 3.7
Alkaline Phosphatase: 66
BUN: 9
CO2: 27
Calcium: 9
Chloride: 105
Creatinine, Ser: 0.61
GFR calc Af Amer: >60
GFR calc non Af Amer: >60
Glucose, Bld: 125 — ABNORMAL HIGH
Potassium: 3.4 — ABNORMAL LOW
Sodium: 138
Total Bilirubin: 0.7
Total Protein: 6.6

## 2011-03-11 LAB — DIFFERENTIAL
Basophils Absolute: 0
Basophils Relative: 0
Eosinophils Absolute: 0
Eosinophils Relative: 1
Lymphocytes Relative: 12
Lymphs Abs: 1.2
Monocytes Absolute: 0.6
Monocytes Relative: 6
Neutro Abs: 7.8 — ABNORMAL HIGH
Neutrophils Relative %: 80 — ABNORMAL HIGH

## 2011-03-11 LAB — GC/CHLAMYDIA PROBE AMP, GENITAL: GC Probe Amp, Genital: NEGATIVE

## 2011-03-11 LAB — CBC
HCT: 40.4
Hemoglobin: 13.9
MCHC: 34.5
MCV: 89.8
Platelets: 235
RBC: 4.5
RDW: 13
WBC: 9.7

## 2011-03-11 LAB — PREGNANCY, URINE: Preg Test, Ur: NEGATIVE

## 2011-03-11 LAB — LIPASE, BLOOD: Lipase: 19

## 2011-07-04 ENCOUNTER — Ambulatory Visit (INDEPENDENT_AMBULATORY_CARE_PROVIDER_SITE_OTHER): Payer: BC Managed Care – PPO | Admitting: Family Medicine

## 2011-07-04 ENCOUNTER — Encounter: Payer: Self-pay | Admitting: Family Medicine

## 2011-07-04 VITALS — BP 118/75 | HR 76 | Temp 98.4°F | Ht 66.5 in | Wt 155.0 lb

## 2011-07-04 DIAGNOSIS — J329 Chronic sinusitis, unspecified: Secondary | ICD-10-CM | POA: Insufficient documentation

## 2011-07-04 MED ORDER — DOXYCYCLINE HYCLATE 100 MG PO TABS: 100.0000 mg | ORAL_TABLET | Freq: Two times a day (BID) | ORAL | Status: AC

## 2011-07-04 NOTE — Patient Instructions (Signed)
This is a sinus infection Start the Doxycycline as directed Drink plenty of fluids REST! Alternate tylenol and ibuprofen every 4 hrs for pain or fever Delsym as needed for cough Call with any questions or concerns Hang in there!!

## 2011-07-04 NOTE — Progress Notes (Signed)
  Subjective:    Patient ID: Annette Woods, female    DOB: 1960-09-08, 51 y.o.   MRN: [id number]  HPI URI- sore throat started 'a couple of days ago'.  + nasal congestion, sinus pressure, bilateral ear fullness, aches.  No fevers- alternating chills, sweats.  + cough- typically nonproductive.  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  + skin tenderness 'it hurts to touch my hair'.   Review of Systems For ROS see HPI     Objective:   Physical Exam  Vitals reviewed. Constitutional: She appears well-developed and well-nourished. No distress.  HENT:  Head: Normocephalic and atraumatic.  Right Ear: Tympanic membrane normal.  Left Ear: Tympanic membrane normal.  Nose: Mucosal edema and rhinorrhea present. Right sinus exhibits maxillary sinus tenderness and frontal sinus tenderness. Left sinus exhibits maxillary sinus tenderness and frontal sinus tenderness.  Mouth/Throat: Uvula is midline and mucous membranes are normal. Posterior oropharyngeal erythema present. No oropharyngeal exudate.  Eyes: Conjunctivae and EOM are normal. Pupils are equal, round, and reactive to light.  Neck: Normal range of motion. Neck supple.  Cardiovascular: Normal rate, regular rhythm and normal heart sounds.   Pulmonary/Chest: Effort normal and breath sounds normal. No respiratory distress. She has no wheezes.  Lymphadenopathy:    She has no cervical adenopathy.  Skin: Skin is warm and dry.          Assessment & Plan:

## 2011-07-07 NOTE — Assessment & Plan Note (Signed)
New.  sxs and PE consistent w/ infxn.  Start abx.  Reviewed supportive care and red flags that should prompt return.  Pt expressed understanding and is in agreement w/ plan.  

## 2011-08-12 ENCOUNTER — Other Ambulatory Visit: Payer: Self-pay | Admitting: Internal Medicine

## 2011-08-13 NOTE — Telephone Encounter (Signed)
Refill done.  

## 2011-08-19 ENCOUNTER — Other Ambulatory Visit: Payer: Self-pay | Admitting: Internal Medicine

## 2011-08-19 MED ORDER — ESCITALOPRAM OXALATE 10 MG PO TABS: 10.0000 mg | ORAL_TABLET | Freq: Every day | ORAL | Status: DC

## 2011-08-19 NOTE — Telephone Encounter (Signed)
Rx was refilled on 3.18.13.

## 2011-08-19 NOTE — Telephone Encounter (Signed)
Please resend script from 3.25.13 per [facility] but it shows as done  escitalopram (LEXAPRO) 10 MG tablet [Pharmacy Med Name: ESCITALOPRAM 10 MG TABLET] 30 tablet 0 08/19/2011 Sig: TAKE 1 TABLET (10 MG TOTAL) BY MOUTH AS DIRECTED.

## 2011-08-19 NOTE — Telephone Encounter (Signed)
Resent prescription

## 2011-09-16 NOTE — Telephone Encounter (Signed)
Insurance requires a 90-day supply of Escitalopram 10MG  Tablets, per [facility]  Please re-send prescription for a 90-day supply

## 2011-09-16 NOTE — Telephone Encounter (Signed)
OK to fill? #90 with how many refills?

## 2011-09-16 NOTE — Telephone Encounter (Signed)
No further RF w/o OV 

## 2011-09-17 NOTE — Telephone Encounter (Signed)
Can you please call pt to schedule OV. Thanks.

## 2011-09-17 NOTE — Telephone Encounter (Signed)
Called patient at home & left voicemail.

## 2011-09-20 NOTE — Telephone Encounter (Signed)
Patient made appointment for CPE 6.17.13 @ 8:30. Can you send her enough medication to get her thru to her next appointment? Also wanted to know if it could be done today as she is out

## 2011-09-20 NOTE — Telephone Encounter (Signed)
Please advise 

## 2011-09-23 ENCOUNTER — Other Ambulatory Visit: Payer: Self-pay | Admitting: Internal Medicine

## 2011-09-23 MED ORDER — ESCITALOPRAM OXALATE 10 MG PO TABS: 10.0000 mg | ORAL_TABLET | Freq: Every day | ORAL | Status: DC

## 2011-09-23 NOTE — Telephone Encounter (Signed)
Refill done.  

## 2011-09-23 NOTE — Telephone Encounter (Signed)
refill for Escitalopram 10MG  Tablet Qty 90 (required qty for insurance)  Last filled 3.25.13 Take 1-tablet by mouth daily  Future CPE appointment 6.17.13

## 2011-11-11 ENCOUNTER — Ambulatory Visit (INDEPENDENT_AMBULATORY_CARE_PROVIDER_SITE_OTHER): Payer: BC Managed Care – PPO | Admitting: Internal Medicine

## 2011-11-11 ENCOUNTER — Encounter: Payer: Self-pay | Admitting: Internal Medicine

## 2011-11-11 VITALS — BP 116/76 | HR 64 | Temp 98.1°F | Ht 66.5 in | Wt 156.0 lb

## 2011-11-11 DIAGNOSIS — Z0000 Encounter for general adult medical examination without abnormal findings: Secondary | ICD-10-CM

## 2011-11-11 DIAGNOSIS — F411 Generalized anxiety disorder: Secondary | ICD-10-CM

## 2011-11-11 DIAGNOSIS — Z780 Asymptomatic menopausal state: Secondary | ICD-10-CM

## 2011-11-11 DIAGNOSIS — Z8669 Personal history of other diseases of the nervous system and sense organs: Secondary | ICD-10-CM

## 2011-11-11 LAB — CBC WITH DIFFERENTIAL/PLATELET
Basophils Relative: 0.3 % (ref 0.0–3.0)
Eosinophils Absolute: 0.1 10*3/uL (ref 0.0–0.7)
Eosinophils Relative: 1.6 % (ref 0.0–5.0)
Hemoglobin: 14 g/dL (ref 12.0–15.0)
Lymphocytes Relative: 18.3 % (ref 12.0–46.0)
MCHC: 33.2 g/dL (ref 30.0–36.0)
Neutro Abs: 4.2 10*3/uL (ref 1.4–7.7)
RBC: 4.54 Mil/uL (ref 3.87–5.11)

## 2011-11-11 LAB — LIPID PANEL
Cholesterol: 203 mg/dL — ABNORMAL HIGH (ref 0–200)
Total CHOL/HDL Ratio: 4

## 2011-11-11 LAB — COMPREHENSIVE METABOLIC PANEL
Albumin: 4 g/dL (ref 3.5–5.2)
Alkaline Phosphatase: 75 U/L (ref 39–117)
CO2: 25 mEq/L (ref 19–32)
GFR: 93.73 mL/min (ref >60.00)
Glucose, Bld: 78 mg/dL (ref 70–99)
Potassium: 3.6 mEq/L (ref 3.5–5.1)
Sodium: 141 mEq/L (ref 135–145)
Total Protein: 7.1 g/dL (ref 6.0–8.3)

## 2011-11-11 MED ORDER — ESCITALOPRAM OXALATE 10 MG PO TABS: 10.0000 mg | ORAL_TABLET | Freq: Every day | ORAL | Status: DC

## 2011-11-11 NOTE — Assessment & Plan Note (Signed)
Well controlled 

## 2011-11-11 NOTE — Assessment & Plan Note (Signed)
SSRIs helping both anxiety and hot flashes

## 2011-11-11 NOTE — Progress Notes (Signed)
  Subjective:    Patient ID: Annette Woods, female    DOB: 06/08/1960, 51 y.o.   MRN: [id number]  HPI Complete physical exam  Past Medical History  Diagnosis Date  . Nephrolithiasis   . Interstitial cystitis   . Anxiety   . SEIZURES, HX OF 12/17/2006    Qualifier: History of  By: Blossom Hoops MD, Jonetta Speak     Past Surgical History  Procedure Date  . Kidney stone extraction   . Endometiral ablation    History   Social History  . Marital Status: Married    Spouse Name: N/A    Number of Children: 1  . Years of Education: N/A   Occupational History  . Teacher, private school, 5th grade     Social History Main Topics  . Smoking status: Never Smoker   . Smokeless tobacco: Never Used  . Alcohol Use: No  . Drug Use: No  . Sexually Active: Not on file   Other Topics Concern  . Not on file   Social History Narrative   Moved to [facility] 2008 from [place]-----diet: healthy--- exercise: little    Family History  Problem Relation Age of Onset  . Lung cancer Father     smoker   . Diabetes      GF, GM?  Marland Kitchen Heart attack Father     F age 74  . Breast cancer Neg Hx   . Colon cancer Neg Hx   . Stroke Neg Hx      Review of Systems 6 months history of what patient believes is hemorrhoids: Has a grape size knot at the anal area, it hurts from time to time, small amount of blood in the toilet paper one time. Takes Lexapro regularly, it helps a lot with menopausal symptoms and anxiety. Denies chest pain or shortness of breath No nausea, vomiting, diarrhea.      Objective:   Physical Exam  General -- alert, well-developed, and well-nourished.   Neck --no thyromegaly , normal carotid pulse Lungs -- normal respiratory effort, no intercostal retractions, no accessory muscle use, and normal breath sounds.   Heart-- normal rate, regular rhythm, no murmur, and no gallop.   Abdomen--soft, non-tender, no distention, no masses, no HSM, no guarding, and no rigidity.   Extremities-- no pretibial edema  bilaterally neurologic-- alert & oriented X3 and strength normal in all extremities. Psych-- Cognition and judgment appear intact. Alert and cooperative with normal attention span and concentration.  not anxious appearing and not depressed appearing.       Assessment & Plan:

## 2011-11-11 NOTE — Assessment & Plan Note (Signed)
Last TD ?  Gave one today Cscope-- referral Reports hemorrhoids , see HPI, we are referring her to gyn, also for a Cscope  Female care per gyn, needs a new provider , refer to Dr Milton Ferguson or Ernestina Penna Diet-exercise discussed

## 2011-11-11 NOTE — Assessment & Plan Note (Addendum)
H/o SZ 2004, saw neurology then, no further events

## 2011-11-12 ENCOUNTER — Encounter: Payer: Self-pay | Admitting: Internal Medicine

## 2011-11-14 ENCOUNTER — Encounter: Payer: Self-pay | Admitting: *Deleted

## 2011-12-10 ENCOUNTER — Encounter: Payer: Self-pay | Admitting: Internal Medicine

## 2011-12-10 ENCOUNTER — Ambulatory Visit (AMBULATORY_SURGERY_CENTER): Payer: BC Managed Care – PPO

## 2011-12-10 VITALS — Ht 67.0 in | Wt 157.1 lb

## 2011-12-10 DIAGNOSIS — Z1211 Encounter for screening for malignant neoplasm of colon: Secondary | ICD-10-CM

## 2011-12-10 MED ORDER — MOVIPREP 100 G PO SOLR: ORAL | Status: DC

## 2011-12-19 ENCOUNTER — Ambulatory Visit (AMBULATORY_SURGERY_CENTER): Payer: BC Managed Care – PPO | Admitting: Internal Medicine

## 2011-12-19 ENCOUNTER — Encounter: Payer: Self-pay | Admitting: Internal Medicine

## 2011-12-19 VITALS — BP 110/70 | HR 68 | Temp 97.4°F | Resp 15 | Ht 67.0 in | Wt 157.0 lb

## 2011-12-19 DIAGNOSIS — D126 Benign neoplasm of colon, unspecified: Secondary | ICD-10-CM

## 2011-12-19 DIAGNOSIS — Z1211 Encounter for screening for malignant neoplasm of colon: Secondary | ICD-10-CM

## 2011-12-19 MED ORDER — SODIUM CHLORIDE 0.9 % IV SOLN: 500.0000 mL | INTRAVENOUS | Status: DC

## 2011-12-19 NOTE — Op Note (Signed)
[facility] [address] [place], [place]  [address]  COLONOSCOPY PROCEDURE REPORT  PATIENT:  Maple, Odaniel  MR#:  [id number] BIRTHDATE:  Jan 16, 1961, 51 yrs. old  GENDER:  female ENDOSCOPIST:  Iva Boop, MD, [facility] REF. BY:  Willow Ora, M.D. PROCEDURE DATE:  12/19/2011 PROCEDURE:  Colonoscopy with snare polypectomy ASA CLASS:  Class II INDICATIONS:  Routine Risk Screening MEDICATIONS:   These medications were titrated to patient response per physician's verbal order, MAC sedation, administered by CRNA, propofol (Diprivan) 300 mg IV  DESCRIPTION OF PROCEDURE:   After the risks benefits and alternatives of the procedure were thoroughly explained, informed consent was obtained.  Digital rectal exam was performed and revealed no abnormalities.   The LB [id number] endoscope was introduced through the anus and advanced to the cecum, which was identified by both the appendix and ileocecal valve, without limitations.  The quality of the prep was excellent, using MoviPrep.  The instrument was then slowly withdrawn as the colon was fully examined. <<PROCEDUREIMAGES>>  FINDINGS:  A diminutive polyp was found in the sigmoid colon. Polyp was snared without cautery. Retrieval was successful. This was otherwise a normal examination of the colon. Includes right colon retroflexion.   Retroflexed views in the rectum revealed no abnormalities.    The time to cecum = 3:01 minutes. The scope was then withdrawn in 9:31 minutes from the cecum and the procedure completed. COMPLICATIONS:  None ENDOSCOPIC IMPRESSION: 1) Diminutive polyp in the sigmoid colon - removed 2) Otherwise normal examination - excellent prep  REPEAT EXAM:  In for Colonoscopy, pending biopsy results.  Iva Boop, MD, [facility]  CC:  Willow Ora, MD and The Patient  n. eSIGNED:   Iva Boop at 12/19/2011 10:01 AM  [place], [phone]

## 2011-12-19 NOTE — Progress Notes (Signed)
Patient did not experience any of the following events: a burn prior to discharge; a fall within the facility; wrong site/side/patient/procedure/implant event; or a hospital transfer or hospital admission upon discharge from the facility. (G8907) Patient did not have preoperative order for IV antibiotic SSI prophylaxis. (G8918)  

## 2011-12-19 NOTE — Progress Notes (Signed)
Pt denied water or ice chips. Maw  No complaints noted in the recovery room. Maw

## 2011-12-19 NOTE — Progress Notes (Signed)
Pt to the restroom before discharge. Maw   

## 2011-12-19 NOTE — Patient Instructions (Addendum)
There was one tiny polyp removed.  Excellent preparation and views!  Thank you for choosing me and [facility].  Iva Boop, MD, [facility]   Handout was given to your husband on polyps and hemorrhoids per Dr. Leone Payor.  You may resume your medications today.  Please call if any questions or concerns.    YOU HAD AN ENDOSCOPIC PROCEDURE TODAY AT THE [facility]: Refer to the procedure report that was given to you for any specific questions about what was found during the examination.  If the procedure report does not answer your questions, please call your gastroenterologist to clarify.  If you requested that your care partner not be given the details of your procedure findings, then the procedure report has been included in a sealed envelope for you to review at your convenience later.  YOU SHOULD EXPECT: Some feelings of bloating in the abdomen. Passage of more gas than usual.  Walking can help get rid of the air that was put into your GI tract during the procedure and reduce the bloating. If you had a lower endoscopy (such as a colonoscopy or flexible sigmoidoscopy) you may notice spotting of blood in your stool or on the toilet paper. If you underwent a bowel prep for your procedure, then you may not have a normal bowel movement for a few days.  DIET: Your first meal following the procedure should be a light meal and then it is ok to progress to your normal diet.  A half-sandwich or bowl of soup is an example of a good first meal.  Heavy or fried foods are harder to digest and may make you feel nauseous or bloated.  Likewise meals heavy in dairy and vegetables can cause extra gas to form and this can also increase the bloating.  Drink plenty of fluids but you should avoid alcoholic beverages for 24 hours.  ACTIVITY: Your care partner should take you home directly after the procedure.  You should plan to take it easy, moving slowly for the rest of the day.  You can resume  normal activity the day after the procedure however you should NOT DRIVE or use heavy machinery for 24 hours (because of the sedation medicines used during the test).    SYMPTOMS TO REPORT IMMEDIATELY: A gastroenterologist can be reached at any hour.  During normal business hours, 8:30 AM to 5:00 PM Monday through Friday, call [phone].  After hours and on weekends, please call the GI answering service at [phone] who will take a message and have the physician on call contact you.   Following lower endoscopy (colonoscopy or flexible sigmoidoscopy):  Excessive amounts of blood in the stool  Significant tenderness or worsening of abdominal pains  Swelling of the abdomen that is new, acute  Fever of 100F or higher    FOLLOW UP: If any biopsies were taken you will be contacted by phone or by letter within the next 1-3 weeks.  Call your gastroenterologist if you have not heard about the biopsies in 3 weeks.  Our staff will call the home number listed on your records the next business day following your procedure to check on you and address any questions or concerns that you may have at that time regarding the information given to you following your procedure. This is a courtesy call and so if there is no answer at the home number and we have not heard from you through the emergency physician on call, we will assume  that you have returned to your regular daily activities without incident.  SIGNATURES/CONFIDENTIALITY: You and/or your care partner have signed paperwork which will be entered into your electronic medical record.  These signatures attest to the fact that that the information above on your After Visit Summary has been reviewed and is understood.  Full responsibility of the confidentiality of this discharge information lies with you and/or your care-partner.

## 2011-12-19 NOTE — Progress Notes (Signed)
Pressure applied to the abdomen to reach the cecum 

## 2011-12-20 ENCOUNTER — Telehealth: Payer: Self-pay

## 2011-12-20 NOTE — Telephone Encounter (Signed)
  Follow up Call-  Call back number 12/19/2011  Post procedure Call Back phone  # [phone]  Permission to leave phone message Yes     Patient questions:  Do you have a fever, pain , or abdominal swelling? no Pain Score  0 *  Have you tolerated food without any problems? yes  Have you been able to return to your normal activities? yes  Do you have any questions about your discharge instructions: Diet   no Medications  no Follow up visit  no  Do you have questions or concerns about your Care? no  Actions: * If pain score is 4 or above: No action needed, pain <4.

## 2011-12-24 ENCOUNTER — Encounter: Payer: BC Managed Care – PPO | Admitting: Internal Medicine

## 2011-12-26 ENCOUNTER — Encounter: Payer: Self-pay | Admitting: Internal Medicine

## 2011-12-26 NOTE — Progress Notes (Signed)
Quick Note:  Diminutive adenoma Repeat colonoscopy about 11/2016 ______

## 2011-12-30 ENCOUNTER — Other Ambulatory Visit: Payer: Self-pay | Admitting: Obstetrics and Gynecology

## 2012-05-14 ENCOUNTER — Encounter (INDEPENDENT_AMBULATORY_CARE_PROVIDER_SITE_OTHER): Payer: Self-pay | Admitting: General Surgery

## 2012-05-18 ENCOUNTER — Encounter (INDEPENDENT_AMBULATORY_CARE_PROVIDER_SITE_OTHER): Payer: Self-pay | Admitting: General Surgery

## 2012-05-18 ENCOUNTER — Ambulatory Visit (INDEPENDENT_AMBULATORY_CARE_PROVIDER_SITE_OTHER): Payer: BC Managed Care – PPO | Admitting: General Surgery

## 2012-05-18 VITALS — BP 130/74 | HR 72 | Temp 97.3°F | Ht 66.5 in | Wt 156.8 lb

## 2012-05-18 DIAGNOSIS — K648 Other hemorrhoids: Secondary | ICD-10-CM

## 2012-05-18 NOTE — Progress Notes (Signed)
Subjective:     Patient ID: Annette Woods, female   DOB: Jun 09, 1960, 51 y.o.   MRN: [id number]  HPI We're asked to see the patient in consultation by Dr. Vincente Poli to evaluate her for hemorrhoids. The patient is a 51 year old white female who has been having some rectal discomfort for the last year. That time she feels as though she can feel a grape sized mass there. She mostly notes some burning and stinging. She will occasionally notice some blood in her stool. She states that she only has a bowel movement every 2 or 3 days and sometimes the stool is hard to pass.  Review of Systems  Constitutional: Negative.   HENT: Negative.   Eyes: Negative.   Respiratory: Negative.   Cardiovascular: Negative.   Gastrointestinal: Positive for blood in stool and rectal pain.  Genitourinary: Negative.   Musculoskeletal: Negative.   Skin: Negative.   Neurological: Negative.   Hematological: Negative.   Psychiatric/Behavioral: Negative.        Objective:   Physical Exam  Constitutional: She is oriented to person, place, and time. She appears well-developed and well-nourished.  HENT:  Head: Normocephalic and atraumatic.  Eyes: Conjunctivae normal and EOM are normal. Pupils are equal, round, and reactive to light.  Neck: Normal range of motion. Neck supple.  Cardiovascular: Normal rate, regular rhythm and normal heart sounds.   Pulmonary/Chest: Effort normal and breath sounds normal.  Abdominal: Soft. Bowel sounds are normal.  Genitourinary:       On rectal exam her rectal skin is healthy and normal appearing. There is no evidence of external hemorrhoidal disease or significant skin tag. On digital exam she has good rectal tone and no mass. On anoscopic exam she has mild internal hemorrhoidal tissue  Musculoskeletal: Normal range of motion.  Neurological: She is alert and oriented to person, place, and time.  Skin: Skin is warm and dry.  Psychiatric: She has a normal mood and affect. Her behavior is  normal.       Assessment:     The patient may have very mild internal hemorrhoidal tissue. I think the main portion of her problem is constipation.    Plan:     At this point I would recommend using MiraLAX on a daily basis. If she continues to have discomfort and bleeding then we would plan to see her back in a couple months to reevaluate her.

## 2012-05-18 NOTE — Patient Instructions (Signed)
Use miralax every day for constipation

## 2012-06-11 ENCOUNTER — Encounter: Payer: Self-pay | Admitting: Family Medicine

## 2012-06-11 ENCOUNTER — Ambulatory Visit (INDEPENDENT_AMBULATORY_CARE_PROVIDER_SITE_OTHER): Payer: BC Managed Care – PPO | Admitting: Family Medicine

## 2012-06-11 VITALS — BP 114/70 | HR 81 | Temp 98.4°F | Wt 159.0 lb

## 2012-06-11 DIAGNOSIS — R300 Dysuria: Secondary | ICD-10-CM

## 2012-06-11 LAB — POCT URINALYSIS DIPSTICK
Bilirubin, UA: NEGATIVE
Glucose, UA: NEGATIVE
Ketones, UA: NEGATIVE
Protein, UA: NEGATIVE
Spec Grav, UA: 1.015

## 2012-06-11 MED ORDER — PHENAZOPYRIDINE HCL 200 MG PO TABS: ORAL_TABLET | ORAL | Status: DC

## 2012-06-11 MED ORDER — CIPROFLOXACIN HCL 500 MG PO TABS: 500.0000 mg | ORAL_TABLET | Freq: Two times a day (BID) | ORAL | Status: DC

## 2012-06-11 NOTE — Patient Instructions (Addendum)
Dysuria  Dysuria is the medical term for pain with urination. There are many causes for dysuria, but urinary tract infection is the most common. If a urinalysis was performed it can show that there is a urinary tract infection. A urine culture confirms that you or your child is sick. You will need to follow up with a healthcare provider because:  · If a urine culture was done you will need to know the culture results and treatment recommendations.  · If the urine culture was positive, you or your child will need to be put on antibiotics or know if the antibiotics prescribed are the right antibiotics for your urinary tract infection.  · If the urine culture is negative (no urinary tract infection), then other causes may need to be explored or antibiotics need to be stopped.  Today laboratory work may have been done and there does not seem to be an infection. If cultures were done they will take at least 24 to 48 hours to be completed.  Today x-rays may have been taken and they read as normal. No cause can be found for the problems. The x-rays may be re-read by a radiologist and you will be contacted if additional findings are made.  You or your child may have been put on medications to help with this problem until you can see your primary caregiver. If the problems get better, see your primary caregiver if the problems return. If you were given antibiotics (medications which kill germs), take all of the mediations as directed for the full course of treatment.   If laboratory work was done, you need to find the results. Leave a telephone number where you can be reached. If this is not possible, make sure you find out how you are to get test results.  HOME CARE INSTRUCTIONS   · Drink lots of fluids. For adults, drink eight, 8 ounce glasses of clear juice or water a day. For children, replace fluids as suggested by your caregiver.  · Empty the bladder often. Avoid holding urine for long periods of time.  · After a bowel  movement, women should cleanse front to back, using each tissue only once.  · Empty your bladder before and after sexual intercourse.  · Take all the medicine given to you until it is gone. You may feel better in a few days, but TAKE ALL MEDICINE.  · Avoid caffeine, tea, alcohol and carbonated beverages, because they tend to irritate the bladder.  · In men, alcohol may irritate the prostate.  · Only take over-the-counter or prescription medicines for pain, discomfort, or fever as directed by your caregiver.  · If your caregiver has given you a follow-up appointment, it is very important to keep that appointment. Not keeping the appointment could result in a chronic or permanent injury, pain, and disability. If there is any problem keeping the appointment, you must call back to this facility for assistance.  SEEK IMMEDIATE MEDICAL CARE IF:   · Back pain develops.  · A fever develops.  · There is nausea (feeling sick to your stomach) or vomiting (throwing up).  · Problems are no better with medications or are getting worse.  MAKE SURE YOU:   · Understand these instructions.  · Will watch your condition.  · Will get help right away if you are not doing well or get worse.  Document Released: 02/09/2004 Document Revised: 08/05/2011 Document Reviewed: 12/17/2007  Exit[employer]® Patient Information ©2013 [employer].

## 2012-06-11 NOTE — Progress Notes (Signed)
  Subjective:    Annette Woods is a 52 y.o. female who complains of burning with urination, frequency, suprapubic pressure and urgency. She has had symptoms for 2 days. Patient also complains of none. Patient denies back pain, congestion, cough, fever, headache, rhinitis, sorethroat and vaginal discharge. Patient does not have a history of recurrent UTI. Patient does not have a history of pyelonephritis.  She does have a hx of IC but has not had a problem in years.   This pain is different that IC.    The following portions of the patient's history were reviewed and updated as appropriate: allergies, current medications, past family history, past medical history, past social history, past surgical history and problem list.  Review of Systems Pertinent items are noted in HPI.    Objective:    BP 114/70  Pulse 81  Temp 98.4 F (36.9 C) (Oral)  Wt 159 lb (72.122 kg)  SpO2 99% General appearance: alert, cooperative, appears stated age and mild distress abd- suprapubic pressure  Laboratory:  Urine dipstick: trace for hemoglobin and trace for leukocyte esterase.   Micro exam: not done.    Assessment:    dysuria      Plan:    Medications: ciprofloxacin. Maintain adequate hydration. Follow up if symptoms not improving, and as needed.

## 2012-06-13 LAB — URINE CULTURE
Colony Count: NO GROWTH
Organism ID, Bacteria: NO GROWTH

## 2012-06-15 ENCOUNTER — Encounter: Payer: Self-pay | Admitting: Internal Medicine

## 2012-06-15 ENCOUNTER — Ambulatory Visit (INDEPENDENT_AMBULATORY_CARE_PROVIDER_SITE_OTHER): Payer: BC Managed Care – PPO | Admitting: Internal Medicine

## 2012-06-15 VITALS — BP 102/68 | HR 88 | Temp 98.2°F | Wt 160.0 lb

## 2012-06-15 DIAGNOSIS — N3010 Interstitial cystitis (chronic) without hematuria: Secondary | ICD-10-CM

## 2012-06-15 LAB — POCT URINALYSIS DIPSTICK
Glucose, UA: NEGATIVE
Leukocytes, UA: NEGATIVE
Nitrite, UA: NEGATIVE
Protein, UA: NEGATIVE
Urobilinogen, UA: 0.2

## 2012-06-15 NOTE — Patient Instructions (Addendum)
Will call you with the referral to Dr. Logan Bores. Call anytime if you have fever, chills, severe abdominal pain, lack of appetite

## 2012-06-15 NOTE — Progress Notes (Signed)
  Subjective:    Patient ID: Annette Woods, female    DOB: [birth year], 52 y.o.   MRN: [id number]  HPI Acute visit Symptoms started 06/09/2012 with urinary frequency, burning and suprapubic pressure. She was seen by Dr. Redmond Baseman, urine culture was negative, and she took Cipro and Pyridium. She's here today because his symptoms are essentially the same, she described that current symptoms are more steady and severe than her usual interstitial cystitis episodes. Also, developed sore throat yesterday. See assessment and plan  Past Medical History  Diagnosis Date  . Nephrolithiasis   . Interstitial cystitis   . Anxiety   . SEIZURES, HX OF 12/17/2006    Qualifier: History of  By: Blossom Hoops MD, Luis    . Hemorrhoids    Past Surgical History  Procedure Date  . Kidney stone extraction   . Endometiral ablation   . Tubal ligation     Review of Systems Denies fever or chills; has developed mild sore throat and some sinus congestion x 1 day. No nausea, vomiting, diarrhea or flank pain. Last bowel movement today. Appetite is very good. No vaginal discharge, vaginal rash or spotting. Admits to mild increase in stress at work.    Objective:   Physical Exam  General -- alert, well-developed, and well-nourished.   HEENT -- TMs normal, throat w/o redness, face symmetric and not tender to palpation  Abdomen-- nondistended, good bowel sounds, mild tenderness at the lower abdomen particularly at the suprapubic area without mass or rebound.   Extremities-- no pretibial edema bilaterally Psych-- Cognition and judgment appear intact. Alert and cooperative with normal attention span and concentration.  not anxious appearing and not depressed appearing.      Assessment & Plan:  Developing a URI? Recommend observation.

## 2012-06-15 NOTE — Assessment & Plan Note (Addendum)
Most likely, her recent urinary symptoms are related to interstitial cystitis. Urine culture was negative, we are going to send a second urine culture. She was a slightly tender in the lower abdomen today however denies fevers, appetite is normal consequently I doubt a process such as diverticulitis or appendicitis ( presenting in an atypical way) nevertheless the patient is advised to call me if she has severe abdominal pain, fever chills. She has a history of kidney stones but history does not point in that direction.  I asked her about anxiety, she has been slightly more stressed at work ;we discussed   increase her medication for anxiety but eventually we agreed that we will leave things how they are and she will call if interested in a change. In the past she tried and failed a number of IC medications and she would like to see a specialist. Refer to Dr. Logan Bores in [place] at her request.

## 2012-06-16 LAB — URINALYSIS, MICROSCOPIC ONLY

## 2012-06-17 ENCOUNTER — Ambulatory Visit (INDEPENDENT_AMBULATORY_CARE_PROVIDER_SITE_OTHER): Payer: BC Managed Care – PPO | Admitting: Family Medicine

## 2012-06-17 VITALS — BP 98/68 | HR 78 | Temp 98.4°F | Wt 162.0 lb

## 2012-06-17 DIAGNOSIS — N390 Urinary tract infection, site not specified: Secondary | ICD-10-CM

## 2012-06-17 DIAGNOSIS — B373 Candidiasis of vulva and vagina: Secondary | ICD-10-CM

## 2012-06-17 DIAGNOSIS — N3010 Interstitial cystitis (chronic) without hematuria: Secondary | ICD-10-CM

## 2012-06-17 DIAGNOSIS — B3731 Acute candidiasis of vulva and vagina: Secondary | ICD-10-CM

## 2012-06-17 LAB — URINE CULTURE
Colony Count: NO GROWTH
Organism ID, Bacteria: NO GROWTH

## 2012-06-17 LAB — POCT URINALYSIS DIPSTICK
Bilirubin, UA: NEGATIVE
Ketones, UA: NEGATIVE
Nitrite, UA: NEGATIVE

## 2012-06-17 MED ORDER — NYSTATIN 100000 UNIT/GM EX OINT: TOPICAL_OINTMENT | Freq: Two times a day (BID) | CUTANEOUS | Status: DC

## 2012-06-17 NOTE — Patient Instructions (Addendum)
You are very red and irritated vaginally Apply the cream twice daily Follow up w/ urology tomorrow Hang in there!!

## 2012-06-17 NOTE — Progress Notes (Signed)
  Subjective:    Patient ID: Annette Woods, female    DOB: 06/22/1960, 52 y.o.   MRN: [id number]  HPI UTI- sxs started 1 week ago w/ 'severe bladder pain' and difficulty initiating stream.  Hx of IC x6 yrs but has been well controlled w/ diet and lifestyle modifications.  Current pain described as 'matches being lit there'.  Saw Dr Drue Novel on Monday, recommended urology (Dr Logan Bores).  Has appt w/ Dr Sherron Monday tomorrow.  Has had negative cxs x2.  Completed course of cipro w/out relief.  No relief w/ pyridium.  + urinary frequency, hesitancy.  'i was up 20 times last night'.  No relief w/ ibuprofen or ES Tylenol.  Drinking water.  Hx of kidney stone but no abd pain, N/V, radiation of pain.  Pain is localized over urethral opening.  No vaginal discharge.   Review of Systems For ROS see HPI     Objective:   Physical Exam  Vitals reviewed. Constitutional: She appears well-developed and well-nourished. No distress.  Abdominal: Soft. She exhibits no distension. There is no tenderness (no suprapubic or CVA tenderness).  Genitourinary: No vaginal discharge found.       Erythema and external vaginal irritation          Assessment & Plan:

## 2012-06-18 ENCOUNTER — Other Ambulatory Visit (HOSPITAL_COMMUNITY): Payer: Self-pay | Admitting: Obstetrics and Gynecology

## 2012-06-18 DIAGNOSIS — N3010 Interstitial cystitis (chronic) without hematuria: Secondary | ICD-10-CM

## 2012-06-18 DIAGNOSIS — R102 Pelvic and perineal pain: Secondary | ICD-10-CM

## 2012-06-19 ENCOUNTER — Ambulatory Visit (HOSPITAL_COMMUNITY)
Admission: RE | Admit: 2012-06-19 | Discharge: 2012-06-19 | Disposition: A | Discharge status: Home / Self Care | Payer: BC Managed Care – PPO | Source: Ambulatory Visit | Attending: Obstetrics and Gynecology | Admitting: Obstetrics and Gynecology

## 2012-06-19 DIAGNOSIS — R3989 Other symptoms and signs involving the genitourinary system: Secondary | ICD-10-CM | POA: Insufficient documentation

## 2012-06-19 DIAGNOSIS — N200 Calculus of kidney: Secondary | ICD-10-CM | POA: Insufficient documentation

## 2012-06-19 DIAGNOSIS — R102 Pelvic and perineal pain: Secondary | ICD-10-CM

## 2012-06-19 DIAGNOSIS — N949 Unspecified condition associated with female genital organs and menstrual cycle: Secondary | ICD-10-CM | POA: Insufficient documentation

## 2012-06-19 DIAGNOSIS — N3010 Interstitial cystitis (chronic) without hematuria: Secondary | ICD-10-CM | POA: Insufficient documentation

## 2012-06-19 DIAGNOSIS — N201 Calculus of ureter: Secondary | ICD-10-CM | POA: Insufficient documentation

## 2012-06-19 DIAGNOSIS — M545 Low back pain, unspecified: Secondary | ICD-10-CM | POA: Insufficient documentation

## 2012-06-19 DIAGNOSIS — R911 Solitary pulmonary nodule: Secondary | ICD-10-CM | POA: Insufficient documentation

## 2012-06-19 LAB — URINE CULTURE: Colony Count: NO GROWTH

## 2012-06-21 NOTE — Assessment & Plan Note (Signed)
New.  This is pt's 3rd visit for similar sxs.  No previous UTI.  Pt has f/u w/ urology tomorrow.  Was unable to wait.  In tears due to degree of pain.  No current abx given 2 previous negative cultures but vaginal exam showed diffuse erythema and irritation consistent w/ external yeast.  Start tx w/ hopes that this will improve her sxs.

## 2012-06-21 NOTE — Assessment & Plan Note (Signed)
New.  This may be causing pt's severe burning pain.  Start tx.  Reviewed supportive care and red flags that should prompt return.  Pt expressed understanding and is in agreement w/ plan.

## 2012-06-25 ENCOUNTER — Telehealth: Payer: Self-pay | Admitting: Internal Medicine

## 2012-06-25 NOTE — Telephone Encounter (Signed)
After the pt was seen here, she saw urology and gyn, eventually a CT showed: --Nonobstructing distal left ureteral calculus and left mid pole  renal calculus --Solitary 4 mm pulmonary nodule at the left lung base. In a low  risk patient, no further follow-up for this finding is needed. If  the patient is considered high risk, follow-up in 1 year to assess  stability would be recommended  Will saw Dr Logan Bores, urology who will try to retrieve the stone. As far as the nodule, she is a non smoker but was exposed at home, but father had lung cancer: recheck a CT lung in 1 year. Pt in agreement

## 2012-11-11 ENCOUNTER — Encounter: Payer: BC Managed Care – PPO | Admitting: Internal Medicine

## 2012-12-25 ENCOUNTER — Other Ambulatory Visit: Payer: Self-pay | Admitting: Internal Medicine

## 2012-12-25 NOTE — Telephone Encounter (Signed)
Refill done for one month per protocol. Pt. Scheduled for future OV 01/01/13

## 2013-01-01 ENCOUNTER — Encounter: Payer: Self-pay | Admitting: Internal Medicine

## 2013-01-05 ENCOUNTER — Encounter: Payer: Self-pay | Admitting: Internal Medicine

## 2013-01-06 ENCOUNTER — Other Ambulatory Visit: Payer: Self-pay | Admitting: *Deleted

## 2013-01-06 ENCOUNTER — Encounter: Payer: Self-pay | Admitting: Internal Medicine

## 2013-01-06 ENCOUNTER — Ambulatory Visit (INDEPENDENT_AMBULATORY_CARE_PROVIDER_SITE_OTHER): Payer: BC Managed Care – PPO | Admitting: Internal Medicine

## 2013-01-06 VITALS — BP 110/70 | HR 76 | Temp 98.4°F | Wt 155.0 lb

## 2013-01-06 DIAGNOSIS — L209 Atopic dermatitis, unspecified: Secondary | ICD-10-CM

## 2013-01-06 DIAGNOSIS — Z91038 Other insect allergy status: Secondary | ICD-10-CM

## 2013-01-06 DIAGNOSIS — Z91030 Bee allergy status: Secondary | ICD-10-CM | POA: Insufficient documentation

## 2013-01-06 DIAGNOSIS — R9389 Abnormal findings on diagnostic imaging of other specified body structures: Secondary | ICD-10-CM

## 2013-01-06 DIAGNOSIS — L2089 Other atopic dermatitis: Secondary | ICD-10-CM

## 2013-01-06 DIAGNOSIS — G4700 Insomnia, unspecified: Secondary | ICD-10-CM

## 2013-01-06 DIAGNOSIS — F411 Generalized anxiety disorder: Secondary | ICD-10-CM

## 2013-01-06 HISTORY — DX: Abnormal findings on diagnostic imaging of other specified body structures: R93.89

## 2013-01-06 MED ORDER — EPINEPHRINE 0.3 MG/0.3ML IJ SOAJ: 0.3000 mg | Freq: Once | INTRAMUSCULAR | Status: DC | PRN

## 2013-01-06 MED ORDER — ZOLPIDEM TARTRATE 10 MG PO TABS: 10.0000 mg | ORAL_TABLET | Freq: Every evening | ORAL | Status: DC | PRN

## 2013-01-06 MED ORDER — ESCITALOPRAM OXALATE 10 MG PO TABS: ORAL_TABLET | ORAL | Status: DC

## 2013-01-06 MED ORDER — PREDNISOLONE ACETATE 1 % OP SUSP: 1.0000 [drp] | Freq: Two times a day (BID) | OPHTHALMIC | Status: DC | PRN

## 2013-01-06 NOTE — Assessment & Plan Note (Signed)
Findings of this CT chest discussed with the patient, plan is to repeat a CT 05-2013

## 2013-01-06 NOTE — Progress Notes (Signed)
  Subjective:    Patient ID: Annette Woods, female    DOB: [birth year], 52 y.o.   MRN: [id number]  HPI Acute visit, we discussed several issues: --One-month history of a rash on and off around the eyes, skin gets dry, swollen and red. Some problems noted behind the ears as well. --Bee allergies, needs a refill on EpiPen --Anxiety, needs a refill on Lexapro, so far has been is working okay, although admits to some stress due to the rash. --Also, has a history of insomnia on and off, in the past it prescribe Ambien works well, she uses rarely, needs a prescription. --Also, likes to discuss a CT chest.  Past Medical History  Diagnosis Date  . Nephrolithiasis   . Interstitial cystitis   . Anxiety   . SEIZURES, HX OF 12/17/2006    Qualifier: History of  By: Blossom Hoops MD, Luis    . Hemorrhoids    Past Surgical History  Procedure Laterality Date  . Kidney stone extraction    . Endometiral ablation    . Tubal ligation      Review of Systems In reference to the rash, she's not using any new makeup, has no new pets or been exposed to anything new that she can recall. Eyes themselves are normal. Denies chest pain shortness or breath No cough or hemoptysis.    Objective:   Physical Exam BP 110/70  Pulse 76  Temp(Src) 98.4 F (36.9 C) (Oral)  Wt 155 lb (70.308 kg)  BMI 24.65 kg/m2  SpO2 97%  General -- alert, well-developed, NAD.   Skin-- Around the eyes, the skin is slightly scaly, mild erythema (hard to say because the makeup). Similar findings behind the ears, worse on the right Skin on the elbows and knees normal Neurologic-- alert & oriented X3 and strength normal in all extremities. Psych-- Cognition and judgment appear intact. Alert and cooperative with normal attention span and concentration.  not anxious appearing and not depressed appearing.        Assessment & Plan:

## 2013-01-06 NOTE — Assessment & Plan Note (Signed)
Refill Epi-Pen 

## 2013-01-06 NOTE — Assessment & Plan Note (Signed)
Uses Ambien very rarely, prescription provided

## 2013-01-06 NOTE — Patient Instructions (Signed)
Schedule a physical at your earliest convenience  

## 2013-01-06 NOTE — Telephone Encounter (Signed)
During OV verbal orders DR. Paz to refill lexapro 6 month supply and Epipen #1 and 3 RF. Orders re-entered for Epipen and verified with Dr. Drue Novel.

## 2013-01-06 NOTE — Assessment & Plan Note (Addendum)
I rash consistent with ectopic dermatitis, she likes a dermatology referral likes to see Dr Sharene Butters . In the meantime, recommend to take Claritin topical steroids, see prescription.

## 2013-01-06 NOTE — Assessment & Plan Note (Signed)
Seems stable, RF  medications  for 6 months

## 2013-01-07 ENCOUNTER — Encounter: Payer: Self-pay | Admitting: Internal Medicine

## 2013-02-02 ENCOUNTER — Encounter: Payer: Self-pay | Admitting: Lab

## 2013-02-03 ENCOUNTER — Encounter: Payer: Self-pay | Admitting: Internal Medicine

## 2013-02-03 ENCOUNTER — Ambulatory Visit (INDEPENDENT_AMBULATORY_CARE_PROVIDER_SITE_OTHER): Payer: BC Managed Care – PPO | Admitting: Internal Medicine

## 2013-02-03 VITALS — BP 104/71 | HR 68 | Temp 98.2°F | Ht 66.5 in | Wt 154.2 lb

## 2013-02-03 DIAGNOSIS — F411 Generalized anxiety disorder: Secondary | ICD-10-CM

## 2013-02-03 DIAGNOSIS — L209 Atopic dermatitis, unspecified: Secondary | ICD-10-CM

## 2013-02-03 DIAGNOSIS — N3010 Interstitial cystitis (chronic) without hematuria: Secondary | ICD-10-CM

## 2013-02-03 DIAGNOSIS — Z0000 Encounter for general adult medical examination without abnormal findings: Secondary | ICD-10-CM

## 2013-02-03 LAB — POCT URINALYSIS DIPSTICK
Blood, UA: NEGATIVE
Ketones, UA: NEGATIVE
Protein, UA: NEGATIVE
Spec Grav, UA: 1.01

## 2013-02-03 MED ORDER — ESCITALOPRAM OXALATE 10 MG PO TABS: ORAL_TABLET | ORAL | Status: DC

## 2013-02-03 NOTE — Progress Notes (Signed)
  Subjective:    Patient ID: Annette Woods, female    DOB: 04/02/1961, 52 y.o.   MRN: [id number]  HPI Complete physical exam  Past Medical History  Diagnosis Date  . Nephrolithiasis   . Interstitial cystitis   . Anxiety   . SEIZURES, HX OF 12/17/2006    Qualifier: History of  By: Blossom Hoops MD, Luis    . Hemorrhoids   . Abnormal CT scan, chest 01/06/2013   Past Surgical History  Procedure Laterality Date  . Kidney stone extraction      06-2012  . Endometiral ablation    . Tubal ligation     History   Social History  . Marital Status: Married    Spouse Name: N/A    Number of Children: 1  . Years of Education: N/A   Occupational History  . Teacher, private school, 5th grade     Social History Main Topics  . Smoking status: Never Smoker   . Smokeless tobacco: Never Used  . Alcohol Use: No  . Drug Use: No  . Sexual Activity: Not on file   Other Topics Concern  . Not on file   Social History Narrative   Moved to [place] 2008 from [facility]    Family History  Problem Relation Age of Onset  . Lung cancer Father     smoker   . Heart attack Father     F age 66  . Diabetes Other     GF, GM?  Marland Kitchen Breast cancer Neg Hx   . Colon cancer Neg Hx   . Stroke Neg Hx      Review of Systems Diet--healthy Exercise--active at work, she is a Runner, broadcasting/film/video ; no routine exercise On Lexapro symptoms well-controlled, occasional needs and Ambien when necessary No chest pain or shortness or breath No nausea, vomiting, diarrhea. No blood in the stools. History of chronic cystitis, would like her urine checked.    Objective:   Physical Exam BP 104/71  Pulse 68  Temp(Src) 98.2 F (36.8 C)  Ht 5' 6.5" (1.689 m)  Wt 154 lb 3.2 oz (69.945 kg)  BMI 24.52 kg/m2  SpO2 96% General -- alert, well-developed, NAD.  Neck --no thyromegaly , normal carotid pulse Lungs -- normal respiratory effort, no intercostal retractions, no accessory muscle use, and normal breath sounds.  Heart-- normal rate, regular  rhythm, no murmur.  Abdomen-- Not distended, good bowel sounds,soft, non-tender. No rebound or rigidity.  Extremities-- no pretibial edema bilaterally  Neurologic-- alert & oriented X3. Speech, gait normal. Symmetric, strength normal in all extremities.  Psych-- Cognition and judgment appear intact. Alert and cooperative with normal attention span and concentration. not anxious appearing and not depressed appearing.      Assessment & Plan:

## 2013-02-03 NOTE — Patient Instructions (Addendum)
Come back fasting: FLP, CBC, CMP, TSH, vitamin D--- dx v70 Next visit in 6 months

## 2013-02-03 NOTE — Assessment & Plan Note (Addendum)
Tdap 2013 zostavax -- discussed , likes to wait Flu shot-- declined  Cscope-- 11-2011, 1 small polyp, next per GI Female care per, [facility] per  gyn, Dr Milton Ferguson , last visit 2013, due for a check up  On HRT, rec ca and vit D Diet-exercise discussed

## 2013-02-03 NOTE — Assessment & Plan Note (Signed)
Well controlled , RF

## 2013-02-03 NOTE — Assessment & Plan Note (Signed)
To see derm soon, improving

## 2013-02-03 NOTE — Assessment & Plan Note (Signed)
Having possibly a flare up, send a UCX

## 2013-02-04 ENCOUNTER — Encounter: Payer: Self-pay | Admitting: Internal Medicine

## 2013-02-12 ENCOUNTER — Other Ambulatory Visit: Payer: BC Managed Care – PPO

## 2013-02-12 ENCOUNTER — Other Ambulatory Visit: Payer: Self-pay | Admitting: Obstetrics and Gynecology

## 2013-03-22 ENCOUNTER — Ambulatory Visit (INDEPENDENT_AMBULATORY_CARE_PROVIDER_SITE_OTHER): Payer: BC Managed Care – PPO | Admitting: Internal Medicine

## 2013-03-22 ENCOUNTER — Encounter: Payer: Self-pay | Admitting: Internal Medicine

## 2013-03-22 VITALS — BP 122/79 | HR 78 | Temp 98.3°F | Wt 153.0 lb

## 2013-03-22 DIAGNOSIS — J029 Acute pharyngitis, unspecified: Secondary | ICD-10-CM

## 2013-03-22 MED ORDER — AZITHROMYCIN 250 MG PO TABS: ORAL_TABLET | ORAL | Status: DC

## 2013-03-22 NOTE — Progress Notes (Signed)
  Subjective:    Patient ID: Wardell Honour, female    DOB: November 05, 1960, 52 y.o.   MRN: [id number]  HPI Acute visit Symptoms started 4 days ago with chills and sore throat which is getting slightly worse gradually. She spent most of the weekend in bed. Went to work today but is not feeling well. Patient is a Chartered loss adjuster.  Past Medical History  Diagnosis Date  . Nephrolithiasis   . Interstitial cystitis   . Anxiety   . SEIZURES, HX OF 12/17/2006    Qualifier: History of  By: Blossom Hoops MD, Luis    . Hemorrhoids   . Abnormal CT scan, chest 01/06/2013   Past Surgical History  Procedure Laterality Date  . Kidney stone extraction      06-2012  . Endometiral ablation    . Tubal ligation     History   Social History  . Marital Status: Married    Spouse Name: N/A    Number of Children: 1  . Years of Education: N/A   Occupational History  . Teacher, private school, 5th grade     Social History Main Topics  . Smoking status: Never Smoker   . Smokeless tobacco: Never Used  . Alcohol Use: No  . Drug Use: No  . Sexual Activity: Not on file   Other Topics Concern  . Not on file   Social History Narrative   Moved to [facility] 2008 from [facility]    Review of Systems No fever, chills with the onset of symptoms No nausea, vomiting, diarrhea No rash Mild frontal headaches.     Objective:   Physical Exam BP 122/79  Pulse 78  Temp(Src) 98.3 F (36.8 C)  Wt 153 lb (69.4 kg)  BMI 24.33 kg/m2  SpO2 96% General -- alert, well-developed, NAD.   HEENT-- Not pale. TMs normal, throat symmetric, ++ redness , no discharge. Tonsils small, uvula midline. Face symmetric, sinuses not tender to palpation. Nose ++ congested.  Lungs -- normal respiratory effort, no intercostal retractions, no accessory muscle use, and normal breath sounds.  Heart-- normal rate, regular rhythm, no murmur.  extremities-- no pretibial edema bilaterally  Neurologic--  alert & oriented X3. Speech normal, gait normal,  strength normal in all extremities.  Psych-- Cognition and judgment appear intact. Cooperative with normal attention span and concentration. No anxious appearing , no depressed appearing.      Assessment & Plan:  Pharyngitis, Viral versus bacterial. Check a throat cx, start antibiotics and d/c them if cx neg  See instructions

## 2013-03-22 NOTE — Patient Instructions (Addendum)
Rest, fluids , tylenol For cough, take Mucinex DM twice a day as needed  For congestion Use pseudoephedrine behind the coiunter, 30 mg tablet--- one tablet 3-4 times a day Take the antibiotic as prescribed  (zithromax ) until the throat culture came back Call if no better in few days Call anytime if the symptoms are severe  Please reschedule your labs: FLP, CBC, CMP, TSH, vitamin D--- dx v70

## 2013-03-24 LAB — CULTURE, GROUP A STREP: Organism ID, Bacteria: NORMAL

## 2013-03-26 ENCOUNTER — Encounter: Payer: Self-pay | Admitting: *Deleted

## 2013-04-28 ENCOUNTER — Encounter: Payer: Self-pay | Admitting: Internal Medicine

## 2013-06-27 ENCOUNTER — Telehealth: Payer: Self-pay | Admitting: Internal Medicine

## 2013-06-27 DIAGNOSIS — R9389 Abnormal findings on diagnostic imaging of other specified body structures: Secondary | ICD-10-CM

## 2013-06-27 NOTE — Telephone Encounter (Signed)
Advise patient, needs a CT chest followup a nodule, I already entered the order

## 2013-06-29 NOTE — Telephone Encounter (Signed)
lmovm

## 2013-07-06 ENCOUNTER — Telehealth: Payer: Self-pay | Admitting: *Deleted

## 2013-07-06 NOTE — Telephone Encounter (Signed)
Patient husband called to return your phone call back regarding a referral for his wife.

## 2013-07-07 NOTE — Telephone Encounter (Signed)
Patient husband called again regarding a referral for his wife. 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

## 2013-07-08 NOTE — Telephone Encounter (Signed)
Left msg for spouse to call back

## 2013-08-17 ENCOUNTER — Encounter: Payer: Self-pay | Admitting: Internal Medicine

## 2013-08-17 ENCOUNTER — Ambulatory Visit (INDEPENDENT_AMBULATORY_CARE_PROVIDER_SITE_OTHER): Payer: Commercial Managed Care - PPO | Admitting: Internal Medicine

## 2013-08-17 VITALS — BP 114/76 | HR 76 | Temp 97.9°F | Wt 155.0 lb

## 2013-08-17 DIAGNOSIS — F411 Generalized anxiety disorder: Secondary | ICD-10-CM

## 2013-08-17 DIAGNOSIS — R9389 Abnormal findings on diagnostic imaging of other specified body structures: Secondary | ICD-10-CM

## 2013-08-17 DIAGNOSIS — G4700 Insomnia, unspecified: Secondary | ICD-10-CM

## 2013-08-17 MED ORDER — ESCITALOPRAM OXALATE 10 MG PO TABS: ORAL_TABLET | ORAL | Status: DC

## 2013-08-17 NOTE — Progress Notes (Signed)
Pre visit review using our clinic review tool, if applicable. No additional management support is needed unless otherwise documented below in the visit note. 

## 2013-08-17 NOTE — Progress Notes (Signed)
   Subjective:    Patient ID: Annette Woods, female    DOB: 1960-12-27, 53 y.o.   MRN: [id number]  DOS:  08/17/2013 Type of  visit:   ROV Insomnia, well-controlled on as needed Ambien, does not need a refill. Abnormal CT chest, already has a followup CT scheduled. Anxiety, on Lexapro, doing well, needs a refill  ROS Denies fever or chills No chest pain or difficulty breathing No cough or hemoptysis  Past Medical History  Diagnosis Date  . Nephrolithiasis   . Interstitial cystitis   . Anxiety   . SEIZURES, HX OF 12/17/2006    Qualifier: History of  By: Cletus Gash MD, Gay    . Hemorrhoids   . Abnormal CT scan, chest 01/06/2013    Past Surgical History  Procedure Laterality Date  . Kidney stone extraction      06-2012  . Endometiral ablation    . Tubal ligation      History   Social History  . Marital Status: Married    Spouse Name: N/A    Number of Children: 1  . Years of Education: N/A   Occupational History  . Teacher, private school, 5th grade     Social History Main Topics  . Smoking status: Never Smoker   . Smokeless tobacco: Never Used  . Alcohol Use: No  . Drug Use: No  . Sexual Activity: Not on file   Other Topics Concern  . Not on file   Social History Narrative   Moved to [facility] 2008 from [facility]         Medication List       This list is accurate as of: 08/17/13 11:59 PM.  Always use your most recent med list.               EPINEPHrine 0.3 mg/0.3 mL Soaj injection  Commonly known as:  EPIPEN  Inject 0.3 mL (0.3 mg total) into the muscle once as needed.     escitalopram 10 MG tablet  Commonly known as:  LEXAPRO  TAKE 1 TABLET (10 MG TOTAL) BY MOUTH DAILY.     estradiol 0.5 MG tablet  Commonly known as:  ESTRACE  Take 0.5 mg by mouth daily.     NON FORMULARY  Your Life Multi-vitamin-Take one daily     NON FORMULARY  1 application 3 (three) times a week. Estrogen cream     progesterone 100 MG capsule  Commonly known as:  PROMETRIUM    Take 100 mg by mouth daily.     zolpidem 10 MG tablet  Commonly known as:  AMBIEN  Take 1 tablet (10 mg total) by mouth at bedtime as needed.           Objective:   Physical Exam BP 114/76  Pulse 76  Temp(Src) 97.9 F (36.6 C)  Wt 155 lb (70.308 kg)  SpO2 98%  General -- alert, well-developed, NAD.   Lungs -- normal respiratory effort, no intercostal retractions, no accessory muscle use, and normal breath sounds.  Heart-- normal rate, regular rhythm, no murmur.   Neurologic--  alert & oriented X3. Speech normal, gait normal, strength normal in all extremities.   Psych-- Cognition and judgment appear intact. Cooperative with normal attention span and concentration. No anxious or depressed appearing.        Assessment & Plan:   Labs schedule at the time of her physical exam few months ago  were don't, see instructions

## 2013-08-17 NOTE — Patient Instructions (Signed)
Please schedule labs: FLP, CBC, CMP, TSH, vitamin D--- dx v70   Next visit is for a physical exam in 6 months , fasting Please make an appointment

## 2013-08-17 NOTE — Assessment & Plan Note (Signed)
Well-controlled on ambien, Refill as needed

## 2013-08-17 NOTE — Assessment & Plan Note (Signed)
Well-controlled, refill Lexapro

## 2013-08-17 NOTE — Assessment & Plan Note (Signed)
Has a CT already scheduled few days from today

## 2013-08-23 ENCOUNTER — Other Ambulatory Visit: Payer: Commercial Managed Care - PPO

## 2013-08-25 ENCOUNTER — Ambulatory Visit (HOSPITAL_BASED_OUTPATIENT_CLINIC_OR_DEPARTMENT_OTHER)
Admission: RE | Admit: 2013-08-25 | Discharge: 2013-08-25 | Disposition: A | Discharge status: Home / Self Care | Payer: Commercial Managed Care - PPO | Source: Ambulatory Visit | Attending: Internal Medicine | Admitting: Internal Medicine

## 2013-08-25 DIAGNOSIS — R9389 Abnormal findings on diagnostic imaging of other specified body structures: Secondary | ICD-10-CM

## 2013-08-25 DIAGNOSIS — R911 Solitary pulmonary nodule: Secondary | ICD-10-CM | POA: Insufficient documentation

## 2013-08-26 ENCOUNTER — Other Ambulatory Visit: Payer: Commercial Managed Care - PPO

## 2013-08-26 ENCOUNTER — Encounter: Payer: Self-pay | Admitting: *Deleted

## 2013-10-25 ENCOUNTER — Ambulatory Visit: Payer: Commercial Managed Care - PPO | Admitting: Family Medicine

## 2014-04-13 ENCOUNTER — Other Ambulatory Visit: Payer: Self-pay | Admitting: Obstetrics and Gynecology

## 2014-04-15 LAB — CYTOLOGY - PAP

## 2014-06-11 ENCOUNTER — Other Ambulatory Visit: Payer: Self-pay | Admitting: Internal Medicine

## 2014-06-17 ENCOUNTER — Other Ambulatory Visit: Payer: Self-pay | Admitting: Internal Medicine

## 2014-06-29 ENCOUNTER — Ambulatory Visit (INDEPENDENT_AMBULATORY_CARE_PROVIDER_SITE_OTHER): Payer: Managed Care, Other (non HMO) | Admitting: Internal Medicine

## 2014-06-29 ENCOUNTER — Encounter: Payer: Self-pay | Admitting: Internal Medicine

## 2014-06-29 ENCOUNTER — Other Ambulatory Visit: Payer: Self-pay

## 2014-06-29 VITALS — BP 111/77 | HR 68 | Temp 98.1°F | Ht 67.0 in | Wt 162.0 lb

## 2014-06-29 DIAGNOSIS — F419 Anxiety disorder, unspecified: Secondary | ICD-10-CM

## 2014-06-29 DIAGNOSIS — N3010 Interstitial cystitis (chronic) without hematuria: Secondary | ICD-10-CM

## 2014-06-29 DIAGNOSIS — R938 Abnormal findings on diagnostic imaging of other specified body structures: Secondary | ICD-10-CM

## 2014-06-29 DIAGNOSIS — R9389 Abnormal findings on diagnostic imaging of other specified body structures: Secondary | ICD-10-CM

## 2014-06-29 MED ORDER — EPINEPHRINE 0.3 MG/0.3ML IJ SOAJ: 0.3000 mg | Freq: Once | INTRAMUSCULAR | Status: DC | PRN

## 2014-06-29 MED ORDER — ESCITALOPRAM OXALATE 10 MG PO TABS: 10.0000 mg | ORAL_TABLET | Freq: Every day | ORAL | Status: DC

## 2014-06-29 NOTE — Assessment & Plan Note (Signed)
Follow-up by urology, takes hydrocodone as needed

## 2014-06-29 NOTE — Patient Instructions (Signed)
Please come back to the office in 4 months  for a physical exam. Come back fasting    Schedule the visit at the front desk

## 2014-06-29 NOTE — Progress Notes (Signed)
Pre visit review using our clinic review tool, if applicable. No additional management support is needed unless otherwise documented below in the visit note. 

## 2014-06-29 NOTE — Assessment & Plan Note (Addendum)
Symptoms well-controlled on Lexapro although she recognizes a increase in the stress level lately. In addition to refill the medication I counseled about the benefits of exercise, meditation, yoga. She also has a strong religious belief which is great.

## 2014-06-29 NOTE — Assessment & Plan Note (Addendum)
Follow-up CT 2015 stable, no need for further routine CTs

## 2014-06-29 NOTE — Progress Notes (Signed)
Subjective:    Patient ID: Annette Woods, female    DOB: [birth year], 54 y.o.   MRN: [id number]  DOS:  06/29/2014 Type of visit - description : rov Interval history: Anxiety, on Lexapro, reports that she is under a lot of stress related to her job and also d/t mother-in-law being in hospice. Concern about weight gain, is trying to eat healthier here lately, has not been very active.    Review of Systems Denies chest pain, difficulty breathing or lower extremity edema No nausea, vomiting, diarrhea. Occasional constipation. Has interstitial cystitis, symptoms currently well controlled.  Past Medical History  Diagnosis Date  . Nephrolithiasis   . Interstitial cystitis   . Anxiety   . SEIZURES, HX OF 12/17/2006    Qualifier: History of  By: Cletus Gash MD, [place]    . Hemorrhoids   . Abnormal CT scan, chest 01/06/2013    Past Surgical History  Procedure Laterality Date  . Kidney stone extraction      06-2012  . Endometiral ablation    . Tubal ligation      History   Social History  . Marital Status: Married    Spouse Name: N/A    Number of Children: 1  . Years of Education: N/A   Occupational History  . Teacher, private school, 5th grade     Social History Main Topics  . Smoking status: Never Smoker   . Smokeless tobacco: Never Used  . Alcohol Use: No  . Drug Use: No  . Sexual Activity: Not on file   Other Topics Concern  . Not on file   Social History Narrative   Moved to [facility] 2008 from [facility]         Medication List       This list is accurate as of: 06/29/14  7:30 PM.  Always use your most recent med list.               EPINEPHrine 0.3 mg/0.3 mL Soaj injection  Commonly known as:  EPI-PEN  Inject 0.3 mLs (0.3 mg total) into the muscle once as needed.     escitalopram 10 MG tablet  Commonly known as:  LEXAPRO  Take 1 tablet (10 mg total) by mouth daily.     estradiol 0.5 MG tablet  Commonly known as:  ESTRACE  Take 0.5 mg by mouth daily.     HYDROcodone-acetaminophen 10-325 MG per tablet  Commonly known as:  NORCO  Take 1 tablet by mouth every 6 (six) hours as needed. rx by Dr Amalia Hailey, urology     NON FORMULARY  Your Life Multi-vitamin-Take one daily     NON FORMULARY  1 application 3 (three) times a week. Estrogen cream     progesterone 100 MG capsule  Commonly known as:  PROMETRIUM  Take 100 mg by mouth daily.     zolpidem 10 MG tablet  Commonly known as:  AMBIEN  Take 1 tablet (10 mg total) by mouth at bedtime as needed.           Objective:   Physical Exam  Constitutional: She is oriented to person, place, and time. She appears well-developed. No distress.  HENT:  Head: Normocephalic and atraumatic.  Cardiovascular:  RRR, no murmur, rub or gallop  Pulmonary/Chest: Effort normal. No respiratory distress.  CTA B  Musculoskeletal: She exhibits no edema or tenderness.  Neurological: She is alert and oriented to person, place, and time. No cranial nerve deficit. She exhibits normal muscle tone. Coordination  normal.  Speech normal, gait unassisted and normal for age, motor strength appropriate for age   Skin: Skin is warm and dry. No pallor.  No jaundice  Psychiatric: She has a normal mood and affect. Her behavior is normal. Judgment and thought content normal.  Vitals reviewed.       Assessment & Plan:   Problem List Items Addressed This Visit    Anxiety    Symptoms well-controlled on Lexapro although she recognizes increase in the stress level lately. In addition to refill the medication I counseled about the benefits of exercise, meditation, yoga. She also has a strong religious belief which is great.      Relevant Medications   escitalopram (LEXAPRO) tablet   CYSTITIS, CHRONIC INTERSTITIAL    Follow-up by urology, takes hydrocodone as needed      Abnormal CT scan, chest - Primary    Follow-up CT/2015 stable, no need for further routine CTs

## 2014-10-31 ENCOUNTER — Telehealth: Payer: Self-pay | Admitting: Internal Medicine

## 2014-10-31 NOTE — Telephone Encounter (Signed)
Pre Visit letter sent  °

## 2014-11-11 ENCOUNTER — Encounter: Payer: Managed Care, Other (non HMO) | Admitting: Internal Medicine

## 2014-11-21 ENCOUNTER — Encounter: Payer: Managed Care, Other (non HMO) | Admitting: Internal Medicine

## 2015-07-12 ENCOUNTER — Other Ambulatory Visit: Payer: Self-pay | Admitting: Internal Medicine

## 2015-08-02 ENCOUNTER — Telehealth: Payer: Self-pay | Admitting: Internal Medicine

## 2015-08-02 MED ORDER — ESCITALOPRAM OXALATE 10 MG PO TABS: 10.0000 mg | ORAL_TABLET | Freq: Every day | ORAL | Status: DC

## 2015-08-02 NOTE — Telephone Encounter (Signed)
Rx sent 

## 2015-08-02 NOTE — Telephone Encounter (Signed)
Relation to [id number] Call back number:[phone] Pharmacy: [id number] - [place], [place] [phone] (Phone) [phone] (Fax)         Reason for call:  Patient scheduled for 08/23/2015 and will run out of escitalopram (LEXAPRO) 10 MG tablet  patient requesting a few pills to hold her over until appointment.

## 2015-08-10 ENCOUNTER — Other Ambulatory Visit: Payer: Self-pay | Admitting: Internal Medicine

## 2015-08-23 ENCOUNTER — Ambulatory Visit (INDEPENDENT_AMBULATORY_CARE_PROVIDER_SITE_OTHER): Payer: Commercial Managed Care - PPO | Admitting: Internal Medicine

## 2015-08-23 ENCOUNTER — Encounter: Payer: Self-pay | Admitting: Internal Medicine

## 2015-08-23 VITALS — BP 108/76 | HR 76 | Temp 98.0°F | Ht 67.0 in | Wt 167.1 lb

## 2015-08-23 DIAGNOSIS — F419 Anxiety disorder, unspecified: Secondary | ICD-10-CM

## 2015-08-23 DIAGNOSIS — Z09 Encounter for follow-up examination after completed treatment for conditions other than malignant neoplasm: Secondary | ICD-10-CM

## 2015-08-23 MED ORDER — ESCITALOPRAM OXALATE 10 MG PO TABS: 10.0000 mg | ORAL_TABLET | Freq: Every day | ORAL | Status: DC

## 2015-08-23 NOTE — Progress Notes (Signed)
Pre visit review using our clinic review tool, if applicable. No additional management support is needed unless otherwise documented below in the visit note. 

## 2015-08-23 NOTE — Patient Instructions (Signed)
GO TO THE FRONT DESK Schedule your next appointment for a  Check up When?   1 year Fasting?  No

## 2015-08-23 NOTE — Progress Notes (Signed)
Subjective:    Patient ID: Annette Woods, female    DOB: 04-25-1961, 55 y.o.   MRN: [id number]  DOS:  08/23/2015 Type of visit - description : Routine checkup Interval history:  Here for reassessment of anxiety, still under a lot of pressure, work related. Lexapro definitely helps. She takes other steps to decrease anxiety including mindfulness,  Yoga and her faith   Review of Systems  Denies chest pain or difficulty breathing No nausea, vomiting, diarrhea. No depression per se Past Medical History  Diagnosis Date  . Nephrolithiasis   . Interstitial cystitis   . Anxiety   . SEIZURES, HX OF 12/17/2006    Qualifier: History of  By: Cletus Gash MD, [place]    . Hemorrhoids   . Abnormal CT scan, chest 01/06/2013    Past Surgical History  Procedure Laterality Date  . Kidney stone extraction      06-2012  . Endometiral ablation    . Tubal ligation      Social History   Social History  . Marital Status: Married    Spouse Name: N/A  . Number of Children: 1  . Years of Education: N/A   Occupational History  . Teacher, private school, 5th grade     Social History Main Topics  . Smoking status: Never Smoker   . Smokeless tobacco: Never Used  . Alcohol Use: No  . Drug Use: No  . Sexual Activity: Not on file   Other Topics Concern  . Not on file   Social History Narrative   Moved to [facility] from [facility]         Medication List       This list is accurate as of: 08/23/15 11:59 PM.  Always use your most recent med list.               EPINEPHrine 0.3 mg/0.3 mL Soaj injection  Commonly known as:  EPI-PEN  Inject 0.3 mLs (0.3 mg total) into the muscle once as needed.     escitalopram 10 MG tablet  Commonly known as:  LEXAPRO  Take 1 tablet (10 mg total) by mouth daily.     estradiol 0.5 MG tablet  Commonly known as:  ESTRACE  Take 0.5 mg by mouth daily.     NON FORMULARY  Your Life Multi-vitamin-Take one daily     NON FORMULARY  1 application 3 (three) times  a week. Estrogen cream     progesterone 100 MG capsule  Commonly known as:  PROMETRIUM  Take 100 mg by mouth daily.           Objective:   Physical Exam BP 108/76 mmHg  Pulse 76  Temp(Src) 98 F (36.7 C) (Oral)  Ht 5\' 7"  (1.702 m)  Wt 167 lb 2 oz (75.807 kg)  BMI 26.17 kg/m2  SpO2 97% General:   Well developed, well nourished . NAD.  HEENT:  Normocephalic . Face symmetric, atraumatic Lungs:  CTA B Normal respiratory effort, no intercostal retractions, no accessory muscle use. Heart: RRR,  no murmur.  No pretibial edema bilaterally  Skin: Not pale. Not jaundice Neurologic:  alert & oriented X3.  Speech normal, gait appropriate for age and unassisted Psych--  Cognition and judgment appear intact.  Cooperative with normal attention span and concentration.  Behavior appropriate. No anxious or depressed appearing.      Assessment & Plan:    Assessment Anxiety, insomnia Interstitial cystitis Bee sting allergies, epi-pen Kidney stones H/O Seizures  Abnormal  CT chest, 2014 --> f/u CT 2015 stable, no further CTs BTL  Plan: Anxiety, insomnia: Doing well on Lexapro does not require Ambien anymore. Refill for a year. Primary care: states she sees her gynecologist regularly, states she is doing her physicals. RTC one year

## 2015-08-24 DIAGNOSIS — Z09 Encounter for follow-up examination after completed treatment for conditions other than malignant neoplasm: Secondary | ICD-10-CM | POA: Insufficient documentation

## 2015-08-24 NOTE — Assessment & Plan Note (Signed)
Anxiety, insomnia: Doing well on Lexapro does not require Ambien anymore. Refill for a year. Primary care: states she sees her gynecologist regularly, states she is doing her physicals. RTC one year

## 2015-09-06 ENCOUNTER — Other Ambulatory Visit: Payer: Self-pay | Admitting: Internal Medicine

## 2015-11-29 ENCOUNTER — Other Ambulatory Visit: Payer: Self-pay

## 2015-11-29 MED ORDER — ESCITALOPRAM OXALATE 10 MG PO TABS: 10.0000 mg | ORAL_TABLET | Freq: Every day | ORAL | Status: DC

## 2016-03-21 ENCOUNTER — Encounter: Payer: Self-pay | Admitting: Family Medicine

## 2016-03-21 ENCOUNTER — Ambulatory Visit (INDEPENDENT_AMBULATORY_CARE_PROVIDER_SITE_OTHER): Payer: Commercial Managed Care - PPO | Admitting: Family Medicine

## 2016-03-21 VITALS — BP 118/64 | HR 71 | Temp 98.3°F | Ht 66.0 in | Wt 169.2 lb

## 2016-03-21 DIAGNOSIS — J189 Pneumonia, unspecified organism: Secondary | ICD-10-CM | POA: Diagnosis not present

## 2016-03-21 DIAGNOSIS — Z23 Encounter for immunization: Secondary | ICD-10-CM

## 2016-03-21 MED ORDER — BENZONATATE 100 MG PO CAPS: 100.0000 mg | ORAL_CAPSULE | Freq: Two times a day (BID) | ORAL | 0 refills | Status: DC | PRN

## 2016-03-21 MED ORDER — BENZONATATE 100 MG PO CAPS: 100.0000 mg | ORAL_CAPSULE | Freq: Three times a day (TID) | ORAL | 0 refills | Status: DC | PRN

## 2016-03-21 MED ORDER — AZITHROMYCIN 250 MG PO TABS: ORAL_TABLET | ORAL | 0 refills | Status: DC

## 2016-03-21 NOTE — Progress Notes (Signed)
Pre visit review using our clinic review tool, if applicable. No additional management support is needed unless otherwise documented below in the visit note. 

## 2016-03-21 NOTE — Addendum Note (Signed)
Addended by: Valrico Cellar on: 03/21/2016 05:08 PM   Modules accepted: Orders

## 2016-03-21 NOTE — Addendum Note (Signed)
Addended by: Ames Coupe on: 03/21/2016 04:51 PM   Modules accepted: Orders

## 2016-03-21 NOTE — Progress Notes (Signed)
Chief Complaint  Patient presents with  . Sore Throat    Pt reports having congestion with bad cough on 10/14 and has not been getting better and has now moved into the chest with yellow and green phlem/Pt has been taking dayquil and nightquil with no relief /Pt also reported sore throat    Sherren Kerns Rager here for URI complaints.  Duration: 13 days  Associated symptoms: sinus congestion, sore throat and productive cough Denies: fevers, SOB, muscle aches, ear pain/drainage, itchy/watery eyes Treatment to date: Dayquil, and Nyquil- no relief Sick contacts: No  ROS:  Const: Denies fevers HEENT: As noted in HPI Lungs: No SOB  Past Medical History:  Diagnosis Date  . Abnormal CT scan, chest 01/06/2013  . Anxiety   . Hemorrhoids   . Interstitial cystitis   . Nephrolithiasis   . SEIZURES, HX OF 12/17/2006   Qualifier: History of  By: Cletus Gash MD, [place]     Family History  Problem Relation Age of Onset  . Lung cancer Father     smoker   . Heart attack Father     F age 24  . Diabetes Other     GF, GM?  Marland Kitchen Breast cancer Neg Hx   . Colon cancer Neg Hx   . Stroke Neg Hx     BP 118/64 (BP Location: Left Arm, Patient Position: Sitting, Cuff Size: Small)   Pulse 71   Temp 98.3 F (36.8 C) (Oral)   Ht 5\' 6"  (1.676 m)   Wt 169 lb 3.2 oz (76.7 kg)   SpO2 98%   BMI 27.31 kg/m  General: Awake, alert, appears stated age HEENT: AT, Volo, ears patent b/l and TM's neg, nares patent w/o discharge, pharynx pink and without exudates, MMM Neck: No masses or asymmetry Heart: RRR, no murmurs, no bruits Lungs: CTAB, no accessory muscle use Psych: Age appropriate judgment and insight, normal mood and affect  Walking pneumonia - Plan: azithromycin (ZITHROMAX) 250 MG tablet, benzonatate (TESSALON) 100 MG capsule  Orders as above. Wait a few before taking the abx. She did show me a cut on her finger that does not appear infected. If she starts having uncontrollable pain, pus draining, or redness  streaking up finger, seek care. Will update tetanus today. F/u in 1 week if symptoms worsen or fail to improve. Pt voiced understanding and agreement to the plan.  [place], DO 03/21/16 4:44 PM

## 2016-03-21 NOTE — Patient Instructions (Signed)
If your finger starts draining pus, you have severe pain not controllable with Tylenol/ibuprofen, or redness streaking up finger, let us know or seek health care.

## 2016-03-27 ENCOUNTER — Ambulatory Visit: Payer: Commercial Managed Care - PPO | Admitting: Internal Medicine

## 2016-03-27 ENCOUNTER — Telehealth: Payer: Self-pay | Admitting: Internal Medicine

## 2016-03-27 NOTE — Telephone Encounter (Signed)
Spouse canceled patient 2:45pm appointment scheduled today due to patient work conflict patient [facility] with  Percell Miller PA on Friday due to needing a 4pm slot. Charge or no charge

## 2016-03-27 NOTE — Telephone Encounter (Signed)
No Charge 

## 2016-03-29 ENCOUNTER — Ambulatory Visit: Payer: Commercial Managed Care - PPO | Admitting: Medical

## 2016-03-29 ENCOUNTER — Encounter: Payer: Self-pay | Admitting: Internal Medicine

## 2016-03-29 ENCOUNTER — Telehealth: Payer: Self-pay | Admitting: Internal Medicine

## 2016-03-29 DIAGNOSIS — Z0289 Encounter for other administrative examinations: Secondary | ICD-10-CM

## 2016-03-29 NOTE — Telephone Encounter (Signed)
A user error has taken place.

## 2016-05-29 ENCOUNTER — Other Ambulatory Visit: Payer: Self-pay | Admitting: Internal Medicine

## 2016-08-05 ENCOUNTER — Ambulatory Visit (INDEPENDENT_AMBULATORY_CARE_PROVIDER_SITE_OTHER): Payer: Commercial Managed Care - PPO | Admitting: Internal Medicine

## 2016-08-05 ENCOUNTER — Encounter: Payer: Self-pay | Admitting: Internal Medicine

## 2016-08-05 VITALS — BP 118/74 | HR 75 | Temp 98.3°F | Resp 14 | Ht 66.0 in | Wt 165.4 lb

## 2016-08-05 DIAGNOSIS — R197 Diarrhea, unspecified: Secondary | ICD-10-CM

## 2016-08-05 LAB — CBC WITH DIFFERENTIAL/PLATELET
Basophils Absolute: 0 10*3/uL (ref 0.0–0.1)
Basophils Relative: 0.3 % (ref 0.0–3.0)
EOS PCT: 1.9 % (ref 0.0–5.0)
Eosinophils Absolute: 0.2 10*3/uL (ref 0.0–0.7)
HEMATOCRIT: 43 % (ref 36.0–46.0)
HEMOGLOBIN: 14.1 g/dL (ref 12.0–15.0)
Lymphocytes Relative: 13.1 % (ref 12.0–46.0)
Lymphs Abs: 1.2 10*3/uL (ref 0.7–4.0)
MCHC: 32.8 g/dL (ref 30.0–36.0)
MCV: 91.3 fl (ref 78.0–100.0)
MONO ABS: 0.7 10*3/uL (ref 0.1–1.0)
Monocytes Relative: 8.4 % (ref 3.0–12.0)
NEUTROS ABS: 6.8 10*3/uL (ref 1.4–7.7)
Neutrophils Relative %: 76.3 % (ref 43.0–77.0)
Platelets: 274 10*3/uL (ref 150.0–400.0)
RBC: 4.7 Mil/uL (ref 3.87–5.11)
RDW: 13.7 % (ref 11.5–15.5)
WBC: 8.9 10*3/uL (ref 4.0–10.5)

## 2016-08-05 LAB — COMPREHENSIVE METABOLIC PANEL
ALBUMIN: 4 g/dL (ref 3.5–5.2)
ALT: 10 U/L (ref 0–35)
AST: 15 U/L (ref 0–37)
Alkaline Phosphatase: 75 U/L (ref 39–117)
BUN: 8 mg/dL (ref 6–23)
CHLORIDE: 104 meq/L (ref 96–112)
CO2: 26 meq/L (ref 19–32)
Calcium: 9.3 mg/dL (ref 8.4–10.5)
Creatinine, Ser: 0.73 mg/dL (ref 0.40–1.20)
GFR: 87.71 mL/min (ref >60.00)
Glucose, Bld: 98 mg/dL (ref 70–99)
POTASSIUM: 4.1 meq/L (ref 3.5–5.1)
SODIUM: 136 meq/L (ref 135–145)
Total Bilirubin: 0.4 mg/dL (ref 0.2–1.2)
Total Protein: 7 g/dL (ref 6.0–8.3)

## 2016-08-05 MED ORDER — DICYCLOMINE HCL 20 MG PO TABS: 20.0000 mg | ORAL_TABLET | Freq: Three times a day (TID) | ORAL | 0 refills | Status: DC | PRN

## 2016-08-05 NOTE — Progress Notes (Signed)
Pre visit review using our clinic review tool, if applicable. No additional management support is needed unless otherwise documented below in the visit note. 

## 2016-08-05 NOTE — Patient Instructions (Signed)
GO TO THE LAB : Get the blood work  . Will also need stool samples, get containers   Drink plenty of fluids clear liquids  Rest  Call if not gradually improving in the next 3 or 4 days  Call if you get very weak, dizzy when you stand up, the stomach pain increase or if you get blood in the stools  Take Imodium as needed  Take Bentyl as needed if stomach cramps   Bland Diet A bland diet consists of foods that do not have a lot of fat or fiber. Foods without fat or fiber are easier for the body to digest. They are also less likely to irritate your mouth, throat, stomach, and other parts of your gastrointestinal tract. A bland diet is sometimes called a BRAT diet. What is my plan? Your health care provider or dietitian may recommend specific changes to your diet to prevent and treat your symptoms, such as:  Eating small meals often.  Cooking food until it is soft enough to chew easily.  Chewing your food well.  Drinking fluids slowly.  Not eating foods that are very spicy, sour, or fatty.  Not eating citrus fruits, such as oranges and grapefruit. What do I need to know about this diet?  Eat a variety of foods from the bland diet food list.  Do not follow a bland diet longer than you have to.  Ask your health care provider whether you should take vitamins. What foods can I eat? Grains   Hot cereals, such as cream of wheat. Bread, crackers, or tortillas made from refined white flour. Rice. Vegetables  Canned or cooked vegetables. Mashed or boiled potatoes. Fruits  Bananas. Applesauce. Other types of cooked or canned fruit with the skin and seeds removed, such as canned peaches or pears. Meats and Other Protein Sources  Scrambled eggs. Creamy peanut butter or other nut butters. Lean, well-cooked meats, such as chicken or fish. Tofu. Soups or broths. Dairy  Low-fat dairy products, such as milk, cottage cheese, or yogurt. Beverages  Water. Herbal tea. Apple juice. Sweets  and Desserts  Pudding. Custard. Fruit gelatin. Ice cream. Fats and Oils  Mild salad dressings. Canola or olive oil. The items listed above may not be a complete list of allowed foods or beverages. Contact your dietitian for more options.  What foods are not recommended? Foods and ingredients that are often not recommended include:  Spicy foods, such as hot sauce or salsa.  Fried foods.  Sour foods, such as pickled or fermented foods.  Raw vegetables or fruits, especially citrus or berries.  Caffeinated drinks.  Alcohol.  Strongly flavored seasonings or condiments. The items listed above may not be a complete list of foods and beverages that are not allowed. Contact your dietitian for more information.  This information is not intended to replace advice given to you by your health care provider. Make sure you discuss any questions you have with your health care provider. Document Released: 09/04/2015 Document Revised: 10/19/2015 Document Reviewed: 05/25/2014 Elsevier Interactive Patient Education  2017 [employer].

## 2016-08-05 NOTE — Assessment & Plan Note (Signed)
Acute diarrhea: Started 10 days ago, the patient does not look moderately or severely  dehydrated, abdominal exam without mass or rebound. Infectious etiology?. Plan: CBC, CMP, stool studies including C. difficile, WBC, occult blood, PCR panel. Call if not gradually improving, fluids. Further advise with results

## 2016-08-05 NOTE — Progress Notes (Signed)
Subjective:    Patient ID: Annette Woods, female    DOB: 05-28-1960, 56 y.o.   MRN: [id number]  DOS:  08/05/2016 Type of visit - description : Acute visit Interval history: Symptoms started 07/26/2016 by the time she arrived to [facility]. She started with diarrhea, initially loose, infrequent, is gradually getting worse, currently watery, had 13 episodes in the last 24 hours. At times the BMs are explosive, initially stools were brownish in color now they are dark. No actual blood that she can tell. Symptoms started before she was able to eat anything unusual at the wedding she was attending in [place]. No sick contacts or other family members affected that she recalls She has been able to drink fluids, appetite for solids is decreased.  Review of Systems  Denies fever chills but feels tired No nausea vomiting. Mild lower abdominal pain bilaterally. No dysuria, gross hematuria difficulty urinating. The last time she was prescribed antibiotics was in October. Denies feeling dizzy or weak when she stands up.    Past Medical History:  Diagnosis Date  . Abnormal CT scan, chest 01/06/2013  . Anxiety   . Hemorrhoids   . Interstitial cystitis   . Nephrolithiasis   . SEIZURES, HX OF 12/17/2006   Qualifier: History of  By: Cletus Gash MD, [place]      Past Surgical History:  Procedure Laterality Date  . endometiral ablation    . kidney stone extraction     06-2012  . TUBAL LIGATION      Social History   Social History  . Marital status: Married    Spouse name: N/A  . Number of children: 1  . Years of education: N/A   Occupational History  . Teacher, private school, 5th grade     Social History Main Topics  . Smoking status: Never Smoker  . Smokeless tobacco: Never Used  . Alcohol use No  . Drug use: No  . Sexual activity: Not on file   Other Topics Concern  . Not on file   Social History Narrative   Moved to [place] 2008 from [facility]       Allergies as  of 08/05/2016      Reactions   Penicillins Anaphylaxis   Bee Venom Swelling      Medication List       Accurate as of 08/05/16 12:02 PM. Always use your most recent med list.          dicyclomine 20 MG tablet Commonly known as:  BENTYL Take 1 tablet (20 mg total) by mouth 3 (three) times daily as needed for spasms (Stomach cramps).   EPINEPHrine 0.3 mg/0.3 mL Soaj injection Commonly known as:  EPI-PEN Inject 0.3 mLs (0.3 mg total) into the muscle once as needed.   escitalopram 10 MG tablet Commonly known as:  LEXAPRO Take 1 tablet (10 mg total) by mouth daily.   estradiol 0.5 MG tablet Commonly known as:  ESTRACE Take 0.5 mg by mouth daily.   NON FORMULARY Your Life Multi-vitamin-Take one daily   NON FORMULARY 1 application 3 (three) times a week. Estrogen cream   progesterone 100 MG capsule Commonly known as:  PROMETRIUM Take 100 mg by mouth daily.          Objective:   Physical Exam BP 118/74 (BP Location: Left Arm, Patient Position: Sitting, Cuff Size: Normal)   Pulse 75   Temp 98.3 F (36.8 C) (Oral)   Resp 14   Ht 5\' 6"  (1.676  m)   Wt 165 lb 6 oz (75 kg)   SpO2 98%   BMI 26.69 kg/m  General:   Well developed, well nourished, looks tired but not toxic appearing.  HEENT:  Normocephalic . Face symmetric, atraumatic. Not pale. Oral membranes slightly dry Lungs:  CTA B Normal respiratory effort, no intercostal retractions, no accessory muscle use. Heart: RRR,  no murmur.  no pretibial edema bilaterally  Abdomen:  Not distended, soft, increased bowel sounds, slightly tender on the lower abdomen bilaterally without mass or rebound. Skin: Not pale. Not jaundice Neurologic:  alert & oriented X3.  Speech normal, gait appropriate for age and unassisted Psych--  Cognition and judgment appear intact.  Cooperative with normal attention span and concentration.  Behavior appropriate. No anxious or depressed appearing.     Assessment & Plan:    Assessment Anxiety, insomnia Interstitial cystitis Bee sting allergies, epi-pen Kidney stones H/O Seizures  Abnormal CT chest, 2014 --> f/u CT 2015 stable, no further CTs BTL  Plan:  Acute diarrhea: Started 10 days ago, the patient does not look moderately or severely  dehydrated, abdominal exam without mass or rebound. Infectious etiology?. Plan: CBC, CMP, stool studies including C. difficile, WBC, occult blood, PCR panel. Call if not gradually improving, fluids. Further advise with results

## 2016-08-06 ENCOUNTER — Telehealth: Payer: Self-pay | Admitting: Internal Medicine

## 2016-08-06 NOTE — Telephone Encounter (Signed)
Patient called to inform provider that she is still in pain. Wanted to know if lab results are completed. Plse adv

## 2016-08-07 ENCOUNTER — Encounter (HOSPITAL_BASED_OUTPATIENT_CLINIC_OR_DEPARTMENT_OTHER): Payer: Self-pay | Admitting: Emergency Medicine

## 2016-08-07 ENCOUNTER — Ambulatory Visit (INDEPENDENT_AMBULATORY_CARE_PROVIDER_SITE_OTHER): Payer: Commercial Managed Care - PPO | Admitting: Family Medicine

## 2016-08-07 ENCOUNTER — Emergency Department (HOSPITAL_BASED_OUTPATIENT_CLINIC_OR_DEPARTMENT_OTHER)
Admission: EM | Admit: 2016-08-07 | Discharge: 2016-08-07 | Disposition: A | Discharge status: Home / Self Care | Payer: Commercial Managed Care - PPO | Attending: Emergency Medicine | Admitting: Emergency Medicine

## 2016-08-07 ENCOUNTER — Emergency Department (HOSPITAL_BASED_OUTPATIENT_CLINIC_OR_DEPARTMENT_OTHER): Payer: Commercial Managed Care - PPO

## 2016-08-07 VITALS — BP 107/70 | HR 77 | Temp 98.4°F | Wt 164.8 lb

## 2016-08-07 DIAGNOSIS — R197 Diarrhea, unspecified: Secondary | ICD-10-CM

## 2016-08-07 DIAGNOSIS — A09 Infectious gastroenteritis and colitis, unspecified: Secondary | ICD-10-CM

## 2016-08-07 DIAGNOSIS — R112 Nausea with vomiting, unspecified: Secondary | ICD-10-CM

## 2016-08-07 DIAGNOSIS — Z79899 Other long term (current) drug therapy: Secondary | ICD-10-CM | POA: Diagnosis not present

## 2016-08-07 DIAGNOSIS — K5100 Ulcerative (chronic) pancolitis without complications: Secondary | ICD-10-CM | POA: Insufficient documentation

## 2016-08-07 DIAGNOSIS — R1084 Generalized abdominal pain: Secondary | ICD-10-CM | POA: Diagnosis not present

## 2016-08-07 DIAGNOSIS — R110 Nausea: Secondary | ICD-10-CM

## 2016-08-07 LAB — COMPREHENSIVE METABOLIC PANEL
ALBUMIN: 3.8 g/dL (ref 3.5–5.0)
ALT: 10 U/L — ABNORMAL LOW (ref 14–54)
ANION GAP: 5 (ref 5–15)
AST: 15 U/L (ref 15–41)
Alkaline Phosphatase: 69 U/L (ref 38–126)
BUN: 12 mg/dL (ref 6–20)
CHLORIDE: 105 mmol/L (ref 101–111)
CO2: 25 mmol/L (ref 22–32)
Calcium: 8.5 mg/dL — ABNORMAL LOW (ref 8.9–10.3)
Creatinine, Ser: 0.73 mg/dL (ref 0.44–1.00)
GFR calc non Af Amer: >60 mL/min (ref >60)
GLUCOSE: 83 mg/dL (ref 65–99)
Potassium: 3.4 mmol/L — ABNORMAL LOW (ref 3.5–5.1)
SODIUM: 135 mmol/L (ref 135–145)
Total Bilirubin: 0.5 mg/dL (ref 0.3–1.2)
Total Protein: 7.1 g/dL (ref 6.5–8.1)

## 2016-08-07 LAB — URINALYSIS, MICROSCOPIC (REFLEX)

## 2016-08-07 LAB — CBC WITH DIFFERENTIAL/PLATELET
BASOS PCT: 0 %
Basophils Absolute: 0 10*3/uL (ref 0.0–0.1)
Eosinophils Absolute: 0.1 10*3/uL (ref 0.0–0.7)
Eosinophils Relative: 2 %
HEMATOCRIT: 38.6 % (ref 36.0–46.0)
HEMOGLOBIN: 13 g/dL (ref 12.0–15.0)
LYMPHS PCT: 14 %
Lymphs Abs: 1.1 10*3/uL (ref 0.7–4.0)
MCH: 30.3 pg (ref 26.0–34.0)
MCHC: 33.7 g/dL (ref 30.0–36.0)
MCV: 90 fL (ref 78.0–100.0)
MONO ABS: 1 10*3/uL (ref 0.1–1.0)
MONOS PCT: 13 %
NEUTROS PCT: 71 %
Neutro Abs: 5.9 10*3/uL (ref 1.7–7.7)
Platelets: 247 10*3/uL (ref 150–400)
RBC: 4.29 MIL/uL (ref 3.87–5.11)
RDW: 12.8 % (ref 11.5–15.5)
WBC: 8.2 10*3/uL (ref 4.0–10.5)

## 2016-08-07 LAB — URINALYSIS, ROUTINE W REFLEX MICROSCOPIC
Bilirubin Urine: NEGATIVE
GLUCOSE, UA: NEGATIVE mg/dL
KETONES UR: NEGATIVE mg/dL
LEUKOCYTES UA: NEGATIVE
NITRITE: NEGATIVE
Protein, ur: NEGATIVE mg/dL
SPECIFIC GRAVITY, URINE: 1.024 (ref 1.005–1.030)
pH: 5.5 (ref 5.0–8.0)

## 2016-08-07 LAB — FECAL LACTOFERRIN, QUANT: LACTOFERRIN: POSITIVE

## 2016-08-07 MED ORDER — CIPROFLOXACIN HCL 500 MG PO TABS: 500.0000 mg | ORAL_TABLET | Freq: Two times a day (BID) | ORAL | 0 refills | Status: DC

## 2016-08-07 MED ORDER — IOPAMIDOL (ISOVUE-300) INJECTION 61%
100.0000 mL | Freq: Once | INTRAVENOUS | Status: AC | PRN
  Administered 2016-08-07: 100 mL via INTRAVENOUS

## 2016-08-07 MED ORDER — ONDANSETRON HCL 4 MG/2ML IJ SOLN
4.0000 mg | Freq: Once | INTRAMUSCULAR | Status: AC | PRN
  Administered 2016-08-07: 4 mg via INTRAVENOUS
  Filled 2016-08-07: qty 2

## 2016-08-07 MED ORDER — ACIDOPHILUS PROBIOTIC 10 MG PO TABS: 10.0000 mg | ORAL_TABLET | Freq: Three times a day (TID) | ORAL | 0 refills | Status: DC

## 2016-08-07 MED ORDER — SODIUM CHLORIDE 0.9 % IV BOLUS (SEPSIS)
1000.0000 mL | Freq: Once | INTRAVENOUS | Status: AC
  Administered 2016-08-07: 1000 mL via INTRAVENOUS

## 2016-08-07 MED ORDER — METRONIDAZOLE 500 MG PO TABS
500.0000 mg | ORAL_TABLET | Freq: Two times a day (BID) | ORAL | 0 refills | Status: DC
Start: 2016-08-07 — End: 2016-09-24

## 2016-08-07 MED ORDER — ONDANSETRON 4 MG PO TBDP: 4.0000 mg | ORAL_TABLET | Freq: Three times a day (TID) | ORAL | 0 refills | Status: DC | PRN

## 2016-08-07 MED ORDER — DICYCLOMINE HCL 20 MG PO TABS: 20.0000 mg | ORAL_TABLET | Freq: Two times a day (BID) | ORAL | 0 refills | Status: DC

## 2016-08-07 NOTE — ED Notes (Signed)
ED Provider at bedside. 

## 2016-08-07 NOTE — Progress Notes (Signed)
Pre visit review using our clinic review tool, if applicable. No additional management support is needed unless otherwise documented below in the visit note. 

## 2016-08-07 NOTE — Progress Notes (Signed)
Chief Complaint  Patient presents with  . Severe Stomach Virus    Was seen in the Office on 08/05/2016. Has submitted stool specimens on 08/06/2016. States that she is still experiencing diarrhea, fatigue, nausea,. Has noticed mucus in stools since Monday visit. And also has noticed some specs of blood (Believes that it could be from straining). Stools have varying colors.    Subjective: Patient is a 56 y.o. female here for diarrhea f/u.  She is here for just over 2 weeks of abdominal pain, nausea/vomiting, and diarrhea. She was seen 2 days ago and stool cultures were taken in addition to her being placed on Bentyl and Imodium. She feels like neither the medications are helping. She denies any fevers or bleeding. She did notice some mucus in her stool. She is having difficulty keeping down any fluids and her appetite is much diminished. She feels like she is worsening.   ROS: GI: As noted in HPI Const: No fevers   Family History  Problem Relation Age of Onset  . Lung cancer Father     smoker   . Heart attack Father     F age 33  . Diabetes Other     GF, GM?  Marland Kitchen Breast cancer Neg Hx   . Colon cancer Neg Hx   . Stroke Neg Hx    Past Medical History:  Diagnosis Date  . Abnormal CT scan, chest 01/06/2013  . Anxiety   . Hemorrhoids   . Interstitial cystitis   . Nephrolithiasis   . SEIZURES, HX OF 12/17/2006   Qualifier: History of  By: Cletus Gash MD, [place]     Allergies  Allergen Reactions  . Penicillins Anaphylaxis  . Bee Venom Swelling   No current facility-administered medications for this visit.   Current Outpatient Prescriptions:  .  dicyclomine (BENTYL) 20 MG tablet, Take 1 tablet (20 mg total) by mouth 3 (three) times daily as needed for spasms (Stomach cramps)., Disp: 15 tablet, Rfl: 0 .  EPINEPHrine 0.3 mg/0.3 mL IJ SOAJ injection, Inject 0.3 mLs (0.3 mg total) into the muscle once as needed., Disp: 1 Device, Rfl: 3 .  escitalopram (LEXAPRO) 10 MG tablet, Take 1 tablet  (10 mg total) by mouth daily., Disp: 90 tablet, Rfl: 1 .  estradiol (ESTRACE) 0.5 MG tablet, Take 0.5 mg by mouth daily., Disp: , Rfl:  .  NON FORMULARY, Your Life Multi-vitamin-Take one daily, Disp: , Rfl:  .  NON FORMULARY, 1 application 3 (three) times a week. Estrogen cream, Disp: , Rfl:  .  progesterone (PROMETRIUM) 100 MG capsule, Take 100 mg by mouth daily., Disp: , Rfl:   Facility-Administered Medications Ordered in Other Visits:  .  ondansetron (ZOFRAN) injection 4 mg, 4 mg, Intravenous, Once PRN, Julianne Rice, MD  Objective: BP 107/70 (BP Location: Left Arm, Patient Position: Sitting, Cuff Size: Normal)   Pulse 77   Temp 98.4 F (36.9 C) (Oral)   Wt 164 lb 12.8 oz (74.8 kg)   SpO2 100% Comment: RA  BMI 26.60 kg/m  General: Awake, appears stated age HEENT: Mucous membranes are dry, no oral mucosal lesions. Heart: RRR, no murmurs, no LE edema Lungs: CTAB, no rales, wheezes or rhonchi. No accessory muscle use Abd: BS hyperactive, soft, diffusely TTP, ND, no guarding, no masses or organomegaly, Neg Carnett's, Murphy's, Rovsing's, McBurney's Psych: Age appropriate judgment and insight, normal affect and mood  Assessment and Plan: Diarrhea of presumed infectious origin  Non-intractable vomiting with nausea, unspecified vomiting type  Generalized abdominal  pain  Given worsening of symptoms and inability to adequately maintain a PO diet, will send to ED for further evaluation and hopeful rehydration. If they do imaging, may help with dx. Stop imodium for now, if Bentyl is not helpful, do not take. Did discuss with ED provider. F/ur prn otherwise. If ED visit is fruitless and she is still having issues, she will call back tomorrow and will start empiric therapy for bacterial diarrhea with Cipro and Flagyl. The patient voiced understanding and agreement to the plan.  [place], DO 08/07/16  4:20 PM

## 2016-08-07 NOTE — ED Triage Notes (Signed)
Pt states he is having diarrhea and abd pain x 2weeks

## 2016-08-07 NOTE — Telephone Encounter (Signed)
LMOM informing Pt of lab results, informed that we are still waiting on stools results which are in process, informed we would call with results once received.

## 2016-08-07 NOTE — ED Provider Notes (Signed)
[facility] DE[facility] Provider Note   CSN: [id number] Arrival date & time: 08/07/16  1453 By signing my name below, I, Georgette Shell, attest that this documentation has been prepared under the direction and in the presence of Waynetta Pean, PA-C. Electronically Signed: Georgette Shell, ED Scribe. 08/07/16. 4:46 PM.  History   Chief Complaint Chief Complaint  Patient presents with  . Abdominal Pain  . Diarrhea    HPI The history is provided by the patient. No language interpreter was used.   HPI Comments: Annette Woods is a 56 y.o. female with h/o seizures, hemorrhoids, and nephrolithiasis, who presents to the Emergency Department complaining of gradually worsening, intermittent diarrhea (up to 10x daily) beginning two weeks ago with associated mild, generalized abdominal pain. She describes her pain as cramping and sore. Pt states that she had ate a hamburger at the onset of her symptoms. Her last episode of diarrhea was around 1pm and she has had 8 episodes since midnight last night.  Pt notes that her pain is intermittently exacerbated with eating. She has had decreased appetite for the past week and notes she has only been able to drink fluids starting today. She has taken Bentyl, Imodium, and Pepto Bismol with no relief to her symptoms. Pt was seen by her PCP on 08/05/2016 and had stool studies conducted which have yet to be resulted. Pt is concerned she has C. Difficile and states that her mother-in-law had it 1.5 years ago. No recent antibiotic use. Denies h/o diverticulitis or Crohn's disease. Pt further denies fever, chills, vomiting, urinary symptoms, hematochezia, or any other associated symptoms.   PCP: Kathlene November, MD  Past Medical History:  Diagnosis Date  . Abnormal CT scan, chest 01/06/2013  . Anxiety   . Hemorrhoids   . Interstitial cystitis   . Nephrolithiasis   . SEIZURES, HX OF 12/17/2006   Qualifier: History of  By: Cletus Gash MD, [place]      Patient Active Problem List   Diagnosis Date Noted  . PCP NOTES >>>>>>>>>>>>>>>>>> 08/24/2015  . Atopic dermatitis 01/06/2013  . Abnormal CT scan, chest 01/06/2013  . Bee sting allergy 01/06/2013  . Yeast vaginitis 06/17/2012  . Internal hemorrhoid 05/18/2012  . Annual physical exam 11/11/2011  . Anxiety 09/12/2008  . Menopause 09/12/2008  . INSOMNIA 09/12/2008  . CYSTITIS, CHRONIC INTERSTITIAL 12/17/2006  . NEPHROLITHIASIS, HX OF 12/17/2006    Past Surgical History:  Procedure Laterality Date  . endometiral ablation    . kidney stone extraction     06-2012  . TUBAL LIGATION      OB History    No data available       Home Medications    Prior to Admission medications   Medication Sig Start Date End Date Taking? Authorizing Provider  ciprofloxacin (CIPRO) 500 MG tablet Take 1 tablet (500 mg total) by mouth every 12 (twelve) hours. 08/07/16   Waynetta Pean, PA-C  dicyclomine (BENTYL) 20 MG tablet Take 1 tablet (20 mg total) by mouth 2 (two) times daily. 08/07/16   Waynetta Pean, PA-C  EPINEPHrine 0.3 mg/0.3 mL IJ SOAJ injection Inject 0.3 mLs (0.3 mg total) into the muscle once as needed. 06/29/14   Colon Branch, MD  escitalopram (LEXAPRO) 10 MG tablet Take 1 tablet (10 mg total) by mouth daily. 05/30/16   Colon Branch, MD  estradiol (ESTRACE) 0.5 MG tablet Take 0.5 mg by mouth daily.    Historical Provider, MD  Lactobacillus (ACIDOPHILUS PROBIOTIC) 10 MG TABS Take 10  mg by mouth 3 (three) times daily. 08/07/16   Waynetta Pean, PA-C  metroNIDAZOLE (FLAGYL) 500 MG tablet Take 1 tablet (500 mg total) by mouth 2 (two) times daily. 08/07/16   Waynetta Pean, PA-C  NON FORMULARY Your Life Multi-vitamin-Take one daily    Historical Provider, MD  NON FORMULARY 1 application 3 (three) times a week. Estrogen cream    Historical Provider, MD  ondansetron (ZOFRAN ODT) 4 MG disintegrating tablet Take 1 tablet (4 mg total) by mouth every 8 (eight) hours as needed for nausea or vomiting. 08/07/16   Waynetta Pean, PA-C    progesterone (PROMETRIUM) 100 MG capsule Take 100 mg by mouth daily.    Historical Provider, MD    Family History Family History  Problem Relation Age of Onset  . Lung cancer Father     smoker   . Heart attack Father     F age 32  . Diabetes Other     GF, GM?  Marland Kitchen Breast cancer Neg Hx   . Colon cancer Neg Hx   . Stroke Neg Hx     Social History Social History  Substance Use Topics  . Smoking status: Never Smoker  . Smokeless tobacco: Never Used  . Alcohol use No     Allergies   Penicillins and Bee venom   Review of Systems Review of Systems  Constitutional: Positive for appetite change. Negative for chills and fever.  HENT: Negative for congestion and sore throat.   Eyes: Negative for visual disturbance.  Respiratory: Negative for cough, shortness of breath and wheezing.   Cardiovascular: Negative for chest pain and palpitations.  Gastrointestinal: Positive for abdominal pain, diarrhea and nausea. Negative for abdominal distention, blood in stool and vomiting.  Genitourinary: Negative for decreased urine volume, difficulty urinating, dysuria, flank pain, hematuria and urgency.  Musculoskeletal: Negative for back pain and neck pain.  Skin: Negative for rash.  Neurological: Negative for syncope, light-headedness and headaches.     Physical Exam Updated Vital Signs BP 114/75   Pulse 72   Temp 98.7 F (37.1 C) (Oral)   Resp 18   Ht 5\' 6"  (1.676 m)   Wt 74.4 kg   SpO2 100%   BMI 26.47 kg/m   Physical Exam  Constitutional: She is oriented to person, place, and time. She appears well-developed and well-nourished.  Non-toxic appearance. No distress.  Nontoxic appearing.  HENT:  Head: Normocephalic and atraumatic.  Mouth/Throat: Oropharynx is clear and moist.  Mucous membranes are moist.  Eyes: Conjunctivae are normal. Pupils are equal, round, and reactive to light. Right eye exhibits no discharge. Left eye exhibits no discharge.  Neck: Neck supple.   Cardiovascular: Normal rate, regular rhythm, normal heart sounds and intact distal pulses.  Exam reveals no gallop and no friction rub.   No murmur heard. Pulmonary/Chest: Effort normal and breath sounds normal. No respiratory distress. She has no wheezes. She has no rales.  Abdominal: Soft. Bowel sounds are normal. She exhibits no distension and no mass. There is tenderness in the suprapubic area and left lower quadrant. There is no rebound, no guarding and no CVA tenderness.  Abdomen is soft. Bowel sounds are present. Mild suprapubic and LLQ abdominal tenderness to palpation. No peritoneal signs, no CVA or flank tenderness.  Musculoskeletal: She exhibits no edema.  Lymphadenopathy:    She has no cervical adenopathy.  Neurological: She is alert and oriented to person, place, and time. Coordination normal.  Skin: Skin is warm and dry. Capillary refill takes  less than 2 seconds. No rash noted. She is not diaphoretic. No erythema. No pallor.  Psychiatric: She has a normal mood and affect. Her behavior is normal.  Nursing note and vitals reviewed.    ED Treatments / Results  DIAGNOSTIC STUDIES: Oxygen Saturation is 100% on RA, normal by my interpretation.    COORDINATION OF CARE: 4:46 PM Discussed treatment plan with pt at bedside and pt agreed to plan.  Labs (all labs ordered are listed, but only abnormal results are displayed) Labs Reviewed  COMPREHENSIVE METABOLIC PANEL - Abnormal; Notable for the following:       Result Value   Potassium 3.4 (*)    Calcium 8.5 (*)    ALT 10 (*)    All other components within normal limits  URINALYSIS, ROUTINE W REFLEX MICROSCOPIC - Abnormal; Notable for the following:    Hgb urine dipstick SMALL (*)    All other components within normal limits  URINALYSIS, MICROSCOPIC (REFLEX) - Abnormal; Notable for the following:    Bacteria, UA FEW (*)    Squamous Epithelial / LPF 0-5 (*)    All other components within normal limits  CBC WITH  DIFFERENTIAL/PLATELET    EKG  EKG Interpretation None       Radiology Ct Abdomen Pelvis W Contrast  Result Date: 08/07/2016 CLINICAL DATA:  56 year old female with diarrhea and lower abdominal pain. EXAM: CT ABDOMEN AND PELVIS WITH CONTRAST TECHNIQUE: Multidetector CT imaging of the abdomen and pelvis was performed using the standard protocol following bolus administration of intravenous contrast. CONTRAST:  137mL ISOVUE-300 IOPAMIDOL (ISOVUE-300) INJECTION 61% COMPARISON:  Abdominal CT dated 06/19/2012 FINDINGS: Lower chest: The visualized lung bases are clear. No intra-abdominal free air or free fluid. Hepatobiliary: No focal liver abnormality is seen. No gallstones, gallbladder wall thickening, or biliary dilatation. Pancreas: Unremarkable. No pancreatic ductal dilatation or surrounding inflammatory changes. Spleen: Normal in size without focal abnormality. Adrenals/Urinary Tract: The adrenal glands are unremarkable. There is a nonobstructing 2 mm left renal interpolar stone. No hydronephrosis. The right kidney is unremarkable. The visualized ureters and urinary bladder appear unremarkable as well. Stomach/Bowel: There is inflammatory changes and thickening of the colon. There may be mild involvement of the rectum as well. Findings may be infectious in etiology or related to an underlying inflammatory bowel disease such as ulcerative colitis. Correlation with clinical exam and stool cultures and history of inflammatory bowel disease recommended. There is no evidence of bowel obstruction. Normal appendix. Vascular/Lymphatic: The abdominal aorta and IVC appear unremarkable. The origins of the celiac axis, SMA, IMA as well as the origins of the renal arteries are patent. The SMV, splenic vein, and main portal vein are patent. No portal venous gas identified. Small scattered mesenteric lymph nodes noted there is mild stranding of the mesentery. There is no adenopathy. Reproductive: The uterus is  anteverted and grossly unremarkable. Bilateral tubal occlusive devices noted. The ovaries appear unremarkable. Other: Small fat containing umbilical hernia. Musculoskeletal: No acute or significant osseous findings. IMPRESSION: 1. Pancolitis may be infectious in etiology or related to underlying inflammatory bowel disease such as ulcerative colitis. Correlation with clinical exam and stool cultures recommended. No bowel obstruction. Normal appendix. 2. Nonobstructing 2 mm left renal interpolar stone. No hydronephrosis. Electronically Signed   By: Anner Crete M.D.   On: 08/07/2016 19:09    Procedures Procedures (including critical care time)  Medications Ordered in ED Medications  ondansetron (ZOFRAN) injection 4 mg (4 mg Intravenous Given 08/07/16 1623)  sodium chloride 0.9 %  bolus 1,000 mL (0 mLs Intravenous Stopped 08/07/16 1735)  iopamidol (ISOVUE-300) 61 % injection 100 mL (100 mLs Intravenous Contrast Given 08/07/16 1835)     Initial Impression / Assessment and Plan / ED Course  I have reviewed the triage vital signs and the nursing notes.  Pertinent labs & imaging results that were available during my care of the patient were reviewed by me and considered in my medical decision making (see chart for details).    This  is a 56 y.o. female with h/o seizures, hemorrhoids, and nephrolithiasis, who presents to the Emergency Department complaining of gradually worsening, intermittent diarrhea (up to 10x daily) beginning two weeks ago with associated mild, generalized abdominal pain.  She has taken Bentyl, Imodium, and Pepto Bismol with no relief to her symptoms. Pt was seen by her PCP on 08/05/2016 and had stool studies conducted which have yet to be resulted. Pt is concerned she has C. Difficile and states that her mother-in-law had it 1.5 years ago. No recent antibiotic use. Denies h/o diverticulitis or Crohn's disease. Pt further denies fever or vomiting.   On exam patient is afebrile and  nontoxic-appearing. Her abdomen is soft and she has suprapubic and left lower quadrant abdominal tenderness to palpation. No peritoneal signs. Decreased numbers are moist.  On chart review patient has pending stool culture studies by her primary care doctor.  Urinalysis is without sign of infection. CMP is unremarkable. CBC is within normal limits. No leukocytosis. CT abdomen and pelvis with contrast was obtained which showed pancolitis which may be infectious in etiology related to underlying inflammatory bowel disease.  Patient is nontoxic appearing on exam. She is tolerating by mouth. She does not appear dehydrated. Will discharge with ten-day course of Cipro and Flagyl as well as prescriptions for Bentyl and Zofran to use as needed. I encouraged her to follow-up on her stool culture results by her primary care doctor. I encouraged her to make an appointment for follow-up with her gastroenterologist for further evaluation, especially if her symptoms persist. Discussed strict and specific return precautions. I advised the patient to follow-up with their primary care provider this week. I advised the patient to return to the emergency department with new or worsening symptoms or new concerns. The patient verbalized understanding and agreement with plan.    This patient was discussed with Dr. Lita Mains who agrees with assessment and plan.   Final Clinical Impressions(s) / ED Diagnoses   Final diagnoses:  Pancolitis ([place])  Diarrhea, unspecified type  Nausea    New Prescriptions New Prescriptions   CIPROFLOXACIN (CIPRO) 500 MG TABLET    Take 1 tablet (500 mg total) by mouth every 12 (twelve) hours.   DICYCLOMINE (BENTYL) 20 MG TABLET    Take 1 tablet (20 mg total) by mouth 2 (two) times daily.   LACTOBACILLUS (ACIDOPHILUS PROBIOTIC) 10 MG TABS    Take 10 mg by mouth 3 (three) times daily.   METRONIDAZOLE (FLAGYL) 500 MG TABLET    Take 1 tablet (500 mg total) by mouth 2 (two) times daily.    ONDANSETRON (ZOFRAN ODT) 4 MG DISINTEGRATING TABLET    Take 1 tablet (4 mg total) by mouth every 8 (eight) hours as needed for nausea or vomiting.   I personally performed the services described in this documentation, which was scribed in my presence. The recorded information has been reviewed and is accurate.       Waynetta Pean, PA-C 08/07/16 2040    Julianne Rice, MD 08/09/16 [phone]

## 2016-08-08 ENCOUNTER — Telehealth: Payer: Self-pay | Admitting: Internal Medicine

## 2016-08-08 NOTE — Telephone Encounter (Addendum)
Patient called back. She forgot to inform me at time of previous call that she has had 2 episodes of bowel incontinence since last night. Once while she was asleep and once while awake. She states she has "no recollection" of the episode that happened while sleeping and that the stool "just started coming out." She is not having cramping except when having a BM, but does have muscle soreness r/t continued diarrhea.   Advised patient to continue antibiotics, and start taking probiotic as prescribed. She had not yet started taking this because she did not want to put too much in her stomach. Advised patient to continue PO fluids as tolerated and BRAT diet as tolerated.   She is very concerned about her stool cultures, which are still pending but so far show no infectious etiology. Informed her that we will call her with final results.  She moved her ED follow-up appt to Monday afternoon. Advised her to return to ED over the weekend if worsening/alarm symptoms (severe abd pain, dehydration, weakness) and she agreed to instructions.  Please advise if any additional instructions/recommendations.

## 2016-08-08 NOTE — Telephone Encounter (Signed)
Went to the ER yesterday, diagnosed with colitis, prescribed Cipro and Flagyl. Please check on the patient, feeling better?. Please set up a follow-up for next week.

## 2016-08-08 NOTE — Telephone Encounter (Signed)
Called patient, she reports she is feeling about the same, no worse. She is still having diarrhea. She is taking antibiotics as prescribed and is tolerating clear liquids. ED follow-up scheduled 08/14/16 w/ PCP. Patient instructed to follow-up sooner if any worsening symptoms, or signs of dehydration (weakness, decreased UOP) and agreed to instructions.

## 2016-08-08 NOTE — Telephone Encounter (Signed)
thx

## 2016-08-09 NOTE — Telephone Encounter (Signed)
Noted, no new advise

## 2016-08-10 LAB — STOOL CULTURE

## 2016-08-12 ENCOUNTER — Encounter: Payer: Self-pay | Admitting: Internal Medicine

## 2016-08-12 ENCOUNTER — Ambulatory Visit (INDEPENDENT_AMBULATORY_CARE_PROVIDER_SITE_OTHER): Payer: Commercial Managed Care - PPO | Admitting: Internal Medicine

## 2016-08-12 VITALS — BP 126/74 | HR 66 | Temp 97.8°F | Resp 14 | Ht 66.0 in | Wt 166.5 lb

## 2016-08-12 DIAGNOSIS — K5100 Ulcerative (chronic) pancolitis without complications: Secondary | ICD-10-CM

## 2016-08-12 DIAGNOSIS — R197 Diarrhea, unspecified: Secondary | ICD-10-CM

## 2016-08-12 MED ORDER — RANITIDINE HCL 300 MG PO TABS: 300.0000 mg | ORAL_TABLET | Freq: Every day | ORAL | 3 refills | Status: DC

## 2016-08-12 NOTE — Progress Notes (Signed)
Subjective:    Patient ID: Annette Woods, female    DOB: January 04, 1961, 56 y.o.   MRN: [id number]  DOS:  08/12/2016 Type of visit - description : ER f/u Interval history: After the last office visit, her GI sx got worse and went to the ER. At the ER, labs were okay including a normal white count, CT showed pancolitis. She was treated with Cipro and Flagyl and is here for follow-up.  Stool studies sent after the initial visit here : Culture negative, +  WBCs   Review of Systems At this point she is better. Diarrhea has significantly decreased and the stools are now more formed. No blood in the stools. No fever chills Had nausea and the ER but that is gone. No vomiting. Appetite improving. She continue to be slightly tired and still have some soreness at the mid abdomen. She has developed heartburn as well.  Past Medical History:  Diagnosis Date  . Abnormal CT scan, chest 01/06/2013  . Anxiety   . Hemorrhoids   . Interstitial cystitis   . Nephrolithiasis   . SEIZURES, HX OF 12/17/2006   Qualifier: History of  By: Cletus Gash MD, [place]      Past Surgical History:  Procedure Laterality Date  . endometiral ablation    . kidney stone extraction     06-2012  . TUBAL LIGATION      Social History   Social History  . Marital status: Married    Spouse name: N/A  . Number of children: 1  . Years of education: N/A   Occupational History  . Teacher, private school, 5th grade     Social History Main Topics  . Smoking status: Never Smoker  . Smokeless tobacco: Never Used  . Alcohol use No  . Drug use: No  . Sexual activity: Not on file   Other Topics Concern  . Not on file   Social History Narrative   Moved to [place] 2008 from [facility]       Allergies as of 08/12/2016      Reactions   Penicillins Anaphylaxis   Bee Venom Swelling      Medication List       Accurate as of 08/12/16 11:59 PM. Always use your most recent med list.          ACIDOPHILUS PROBIOTIC 10 MG  Tabs Take 10 mg by mouth 3 (three) times daily.   ciprofloxacin 500 MG tablet Commonly known as:  CIPRO Take 1 tablet (500 mg total) by mouth every 12 (twelve) hours.   dicyclomine 20 MG tablet Commonly known as:  BENTYL Take 1 tablet (20 mg total) by mouth 2 (two) times daily.   EPINEPHrine 0.3 mg/0.3 mL Soaj injection Commonly known as:  EPI-PEN Inject 0.3 mLs (0.3 mg total) into the muscle once as needed.   escitalopram 10 MG tablet Commonly known as:  LEXAPRO Take 1 tablet (10 mg total) by mouth daily.   estradiol 0.5 MG tablet Commonly known as:  ESTRACE Take 0.5 mg by mouth daily.   metroNIDAZOLE 500 MG tablet Commonly known as:  FLAGYL Take 1 tablet (500 mg total) by mouth 2 (two) times daily.   NON FORMULARY Your Life Multi-vitamin-Take one daily   NON FORMULARY 1 application 3 (three) times a week. Estrogen cream   ondansetron 4 MG disintegrating tablet Commonly known as:  ZOFRAN ODT Take 1 tablet (4 mg total) by mouth every 8 (eight) hours as needed for nausea or vomiting.  progesterone 100 MG capsule Commonly known as:  PROMETRIUM Take 100 mg by mouth daily.   ranitidine 300 MG tablet Commonly known as:  ZANTAC Take 1 tablet (300 mg total) by mouth at bedtime.          Objective:   Physical Exam  Abdominal:     BP 126/74 (BP Location: Left Arm, Patient Position: Sitting, Cuff Size: Small)   Pulse 66   Temp 97.8 F (36.6 C) (Oral)   Resp 14   Ht 5\' 6"  (1.676 m)   Wt 166 lb 8 oz (75.5 kg)   SpO2 97%   BMI 26.87 kg/m  General:   Well developed, well nourished . NAD.  HEENT:  Normocephalic . Face symmetric, atraumatic Lungs:  CTA B Normal respiratory effort, no intercostal retractions, no accessory muscle use. Heart: RRR,  no murmur.  no pretibial edema bilaterally  Abdomen:  Not distended, soft, slightly tender, see graphic.   Skin: Not pale. Not jaundice Neurologic:  alert & oriented X3.  Speech normal, gait appropriate for age  and unassisted Psych--  Cognition and judgment appear intact.  Cooperative with normal attention span and concentration.  Behavior appropriate. No anxious or depressed appearing.    Assessment & Plan:   Assessment Anxiety, insomnia Interstitial cystitis Bee sting allergies, epi-pen Kidney stones H/O Seizures  Abnormal CT chest, 2014 --> f/u CT 2015 stable, no further CTs BTL  Plan:  Pancolitis: Recovering from pancolitis, stool cultures were negative, WBCs present. Recommend to see GI, had a colonoscopy with Dr. Carlean Purl in 2013, due for repeat September 2018. She would like to see Dr. Watt Climes, referral sent. Until then, recommend to finish antibiotics, call if sx resurface or is not completely back to normal in 3-4 weeks. Has developed heartburn, recommend Zantac for now  See  instructions

## 2016-08-12 NOTE — Progress Notes (Signed)
Pre visit review using our clinic review tool, if applicable. No additional management support is needed unless otherwise documented below in the visit note. 

## 2016-08-12 NOTE — Patient Instructions (Addendum)
Finish the antibiotics as prescribed  Drink plenty of fluids  Continue taking the probiotic ALIGN , once daily for 6 weeks  We are referring you to GI  Call anytime if the symptoms resurface  Call if not completely back to normal in 3-4 weeks  Zantac for acid reflux

## 2016-08-13 NOTE — Assessment & Plan Note (Signed)
Pancolitis: Recovering from pancolitis, stool cultures were negative, WBCs present. Recommend to see GI, had a colonoscopy with Dr. Carlean Purl in 2013, due for repeat September 2018. She would like to see Dr. Watt Climes, referral sent. Until then, recommend to finish antibiotics, call if sx resurface or is not completely back to normal in 3-4 weeks. Has developed heartburn, recommend Zantac for now  See  instructions

## 2016-08-14 ENCOUNTER — Ambulatory Visit: Payer: Commercial Managed Care - PPO | Admitting: Internal Medicine

## 2016-08-26 ENCOUNTER — Encounter: Payer: Commercial Managed Care - PPO | Admitting: Internal Medicine

## 2016-08-28 ENCOUNTER — Telehealth: Payer: Self-pay | Admitting: Internal Medicine

## 2016-08-28 NOTE — Telephone Encounter (Signed)
Patient was recently seen with colitis, please check on her, feeling better? What she is able to see GI?

## 2016-08-29 NOTE — Telephone Encounter (Signed)
LMOM informing Pt to return call at her convenience to let us know how she is feeling and if she was able to be seen by GI.

## 2016-08-30 NOTE — Telephone Encounter (Signed)
Patient states she is Missing pathology report from the past GI report from her colonoscopy. Need info sent to fax# [phone]. The office that needs the info  number....[phone]   Call back number [phone]

## 2016-09-24 ENCOUNTER — Ambulatory Visit (INDEPENDENT_AMBULATORY_CARE_PROVIDER_SITE_OTHER): Payer: Commercial Managed Care - PPO | Admitting: Internal Medicine

## 2016-09-24 ENCOUNTER — Encounter: Payer: Self-pay | Admitting: Internal Medicine

## 2016-09-24 VITALS — BP 122/76 | HR 73 | Temp 97.6°F | Resp 14 | Ht 66.0 in | Wt 164.1 lb

## 2016-09-24 DIAGNOSIS — Z1159 Encounter for screening for other viral diseases: Secondary | ICD-10-CM

## 2016-09-24 DIAGNOSIS — Z0000 Encounter for general adult medical examination without abnormal findings: Secondary | ICD-10-CM | POA: Diagnosis not present

## 2016-09-24 DIAGNOSIS — Z114 Encounter for screening for human immunodeficiency virus [HIV]: Secondary | ICD-10-CM

## 2016-09-24 MED ORDER — ZOLPIDEM TARTRATE 10 MG PO TABS: 5.0000 mg | ORAL_TABLET | Freq: Every evening | ORAL | 0 refills | Status: DC | PRN

## 2016-09-24 MED ORDER — ESCITALOPRAM OXALATE 20 MG PO TABS: 20.0000 mg | ORAL_TABLET | Freq: Every day | ORAL | 3 refills | Status: DC

## 2016-09-24 NOTE — Progress Notes (Signed)
Pre visit review using our clinic review tool, if applicable. No additional management support is needed unless otherwise documented below in the visit note. 

## 2016-09-24 NOTE — Patient Instructions (Signed)
  GO TO THE FRONT DESK Schedule your next appointment for a  checkup in 6-8 weeks from today  Schedule labs to be done at your convenience, fasting    Please consider seeing a counselor  Increase Lexapro to 20 mg daily  Take Ambien 10 mg: Half tablet at bedtime as needed for difficulty sleeping, okay to increase to one tablet if needed.

## 2016-09-24 NOTE — Progress Notes (Signed)
Subjective:    Patient ID: Annette Woods, female    DOB: 01/18/1961, 56 y.o.   MRN: [id number]  DOS:  09/24/2016 Type of visit - description : cpx Interval history:She has few concerns, see review of systems    Review of Systems Increase anxiety, work-related and also because her husband is having some issues at work. Decreased energy, decreased interest in things, lethargic. Some depression. Denies suicidal ideas. Sleep is poor. GI symptoms: After last visit it took 3-4 weeks to eventually feel better, is almost back to normal. Has interstitial cystitis, symptoms at baseline Had some dental problems, improving, did not get to see a dentist.   Other than above, a 14 point review of systems is negative     Past Medical History:  Diagnosis Date  . Abnormal CT scan, chest 01/06/2013  . Anxiety   . Hemorrhoids   . Interstitial cystitis   . Nephrolithiasis   . SEIZURES, HX OF 12/17/2006   Qualifier: History of  By: Cletus Gash MD, [place]      Past Surgical History:  Procedure Laterality Date  . endometiral ablation    . kidney stone extraction     06-2012  . TUBAL LIGATION      Social History   Social History  . Marital status: Married    Spouse name: N/A  . Number of children: 1  . Years of education: N/A   Occupational History  . Teacher, private school, 5th grade     Social History Main Topics  . Smoking status: Never Smoker  . Smokeless tobacco: Never Used  . Alcohol use No  . Drug use: No  . Sexual activity: Not on file   Other Topics Concern  . Not on file   Social History Narrative   Moved to [place] 2008 from [facility]      Family History  Problem Relation Age of Onset  . Lung cancer Father     smoker   . Heart attack Father     F age 4  . Diabetes Other     GF, GM?  Marland Kitchen Breast cancer Neg Hx   . Colon cancer Neg Hx   . Stroke Neg Hx      Allergies as of 09/24/2016      Reactions   Penicillins Anaphylaxis   Bee Venom Swelling      Medication List         Accurate as of 09/24/16 11:59 PM. Always use your most recent med list.          ACIDOPHILUS PROBIOTIC 10 MG Tabs Take 10 mg by mouth 3 (three) times daily.   EPINEPHrine 0.3 mg/0.3 mL Soaj injection Commonly known as:  EPI-PEN Inject 0.3 mLs (0.3 mg total) into the muscle once as needed.   escitalopram 20 MG tablet Commonly known as:  LEXAPRO Take 1 tablet (20 mg total) by mouth daily.   estradiol 0.5 MG tablet Commonly known as:  ESTRACE Take 0.5 mg by mouth daily.   progesterone 100 MG capsule Commonly known as:  PROMETRIUM Take 100 mg by mouth daily.   zolpidem 10 MG tablet Commonly known as:  AMBIEN Take 0.5-1 tablets (5-10 mg total) by mouth at bedtime as needed for sleep.          Objective:   Physical Exam BP 122/76 (BP Location: Left Arm, Patient Position: Sitting, Cuff Size: Small)   Pulse 73   Temp 97.6 F (36.4 C) (Oral)   Resp 14  Ht 5\' 6"  (1.676 m)   Wt 164 lb 2 oz (74.4 kg)   SpO2 98%   BMI 26.49 kg/m   General:   Well developed, well nourished, no physical distress..  Neck: No  thyromegaly  HEENT:  Normocephalic . Face symmetric, atraumatic Lungs:  CTA B Normal respiratory effort, no intercostal retractions, no accessory muscle use. Heart: RRR,  no murmur.  No pretibial edema bilaterally  Abdomen: Slightly increased bowel sounds, minimal tenderness at the right side without mass or rebound Skin: Exposed areas without rash. Not pale. Not jaundice Neurologic:  alert & oriented X3.  Speech normal, gait appropriate for age and unassisted Strength symmetric and appropriate for age.  Psych: Cognition and judgment appear intact.  Cooperative with normal attention span and concentration.  Behavior appropriate. Tearful during parts of the visit.    Assessment & Plan:   Assessment Anxiety, insomnia Interstitial cystitis - sees urology Bee sting allergies, epi-pen Kidney stones H/O Seizures  Abnormal CT chest, 2014 --> f/u CT 2015  stable, no further CTs BTL  Plan:  anxiety, insomnia: Presents with increase sxs, also depression. In addition to her stressful job, her husband is having some work issues. Denies suicidal ideas. PHQ 9: 13 (moderate) Plan: We talk about the role of exercise, counseling, medication. We'll increase Lexapro from 10 mg to 20 mg, start Ambien, 5-10 mg. Information about our counselors provided. Come back in 6-8 weeks Pancolitis: Finally improving, has a GI appointment pending RTC 6-8 weeks

## 2016-09-24 NOTE — Assessment & Plan Note (Signed)
--  Tdap 2013; zostavax-shingrex   discussed  today -- Cscope-- 11-2011, 1 small polyp, next per GI. Has an appointment pending to see GI. --Female care per,   Dr Tressia Danas , has MMG and PAPs  @ gyn . No records, patient aware --Recommend to take vitamin D, will also talk about diet and exercise --Labs: Return fasting for a BMP, FLP, TSH. HIV and hep C

## 2016-09-25 NOTE — Assessment & Plan Note (Signed)
anxiety, insomnia: Presents with increase sxs, also depression. In addition to her stressful job, her husband is having some work issues. Denies suicidal ideas. PHQ 9: 13 (moderate) Plan: We talk about the role of exercise, counseling, medication. We'll increase Lexapro from 10 mg to 20 mg, start Ambien, 5-10 mg. Information about our counselors provided. Come back in 6-8 weeks Pancolitis: Finally improving, has a GI appointment pending RTC 6-8 weeks

## 2016-10-17 ENCOUNTER — Other Ambulatory Visit: Payer: Self-pay | Admitting: Internal Medicine

## 2016-10-22 ENCOUNTER — Other Ambulatory Visit: Payer: Self-pay

## 2016-10-22 MED ORDER — ESCITALOPRAM OXALATE 20 MG PO TABS: 20.0000 mg | ORAL_TABLET | Freq: Every day | ORAL | 0 refills | Status: DC

## 2016-10-31 ENCOUNTER — Other Ambulatory Visit (INDEPENDENT_AMBULATORY_CARE_PROVIDER_SITE_OTHER): Payer: Commercial Managed Care - PPO

## 2016-10-31 DIAGNOSIS — Z114 Encounter for screening for human immunodeficiency virus [HIV]: Secondary | ICD-10-CM

## 2016-10-31 DIAGNOSIS — Z1159 Encounter for screening for other viral diseases: Secondary | ICD-10-CM

## 2016-10-31 DIAGNOSIS — Z0000 Encounter for general adult medical examination without abnormal findings: Secondary | ICD-10-CM | POA: Diagnosis not present

## 2016-10-31 LAB — BASIC METABOLIC PANEL
BUN: 13 mg/dL (ref 6–23)
CALCIUM: 9.2 mg/dL (ref 8.4–10.5)
CHLORIDE: 106 meq/L (ref 96–112)
CO2: 24 meq/L (ref 19–32)
CREATININE: 0.73 mg/dL (ref 0.40–1.20)
GFR: 87.64 mL/min (ref >60.00)
GLUCOSE: 100 mg/dL — AB (ref 70–99)
Potassium: 3.9 mEq/L (ref 3.5–5.1)
Sodium: 138 mEq/L (ref 135–145)

## 2016-10-31 LAB — LIPID PANEL
CHOL/HDL RATIO: 4
Cholesterol: 216 mg/dL — ABNORMAL HIGH (ref 0–200)
HDL: 51.5 mg/dL (ref >39.00)
LDL CALC: 133 mg/dL — AB (ref 0–99)
NONHDL: 164.94
TRIGLYCERIDES: 159 mg/dL — AB (ref 0.0–149.0)
VLDL: 31.8 mg/dL (ref 0.0–40.0)

## 2016-10-31 LAB — TSH: TSH: 0.79 u[IU]/mL (ref 0.35–4.50)

## 2016-10-31 LAB — HIV ANTIBODY (ROUTINE TESTING W REFLEX): HIV: NONREACTIVE

## 2016-10-31 LAB — HEPATITIS C ANTIBODY: HCV Ab: NEGATIVE

## 2016-11-04 ENCOUNTER — Ambulatory Visit: Payer: Commercial Managed Care - PPO | Admitting: Internal Medicine

## 2016-11-05 LAB — HM COLONOSCOPY

## 2016-11-06 ENCOUNTER — Encounter: Payer: Self-pay | Admitting: Internal Medicine

## 2016-11-06 ENCOUNTER — Ambulatory Visit (INDEPENDENT_AMBULATORY_CARE_PROVIDER_SITE_OTHER): Payer: Commercial Managed Care - PPO | Admitting: Internal Medicine

## 2016-11-06 VITALS — BP 122/66 | HR 67 | Temp 98.0°F | Resp 14 | Ht 67.0 in | Wt 166.0 lb

## 2016-11-06 DIAGNOSIS — F419 Anxiety disorder, unspecified: Secondary | ICD-10-CM | POA: Diagnosis not present

## 2016-11-06 DIAGNOSIS — G4700 Insomnia, unspecified: Secondary | ICD-10-CM | POA: Diagnosis not present

## 2016-11-06 NOTE — Progress Notes (Signed)
Subjective:    Patient ID: Annette Woods, female    DOB: 1960-12-28, 56 y.o.   MRN: [id number]  DOS:  11/06/2016 Type of visit - description : f/u Interval history: Since the last visit, she is taking a higher dose of Lexapro. Anxiety depression much improved. Sleep has also improved, has used Ambien only twice. 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  Review of Systems   Past Medical History:  Diagnosis Date  . Abnormal CT scan, chest 01/06/2013  . Anxiety   . Hemorrhoids   . Interstitial cystitis   . Nephrolithiasis   . SEIZURES, HX OF 12/17/2006   Qualifier: History of  By: Cletus Gash MD, [place]      Past Surgical History:  Procedure Laterality Date  . endometiral ablation    . kidney stone extraction     06-2012  . TUBAL LIGATION      Social History   Social History  . Marital status: Married    Spouse name: N/A  . Number of children: 1  . Years of education: N/A   Occupational History  . Teacher, private school, 5th grade     Social History Main Topics  . Smoking status: Never Smoker  . Smokeless tobacco: Never Used  . Alcohol use No  . Drug use: No  . Sexual activity: Not on file   Other Topics Concern  . Not on file   Social History Narrative   Moved to [place] 2008 from [place] [facility]       Allergies as of 11/06/2016      Reactions   Penicillins Anaphylaxis   Bee Venom Swelling      Medication List       Accurate as of 11/06/16 11:59 PM. Always use your most recent med list.          ACIDOPHILUS PROBIOTIC 10 MG Tabs Take 10 mg by mouth 3 (three) times daily.   EPINEPHrine 0.3 mg/0.3 mL Soaj injection Commonly known as:  EPI-PEN Inject 0.3 mLs (0.3 mg total) into the muscle once as needed.   escitalopram 20 MG tablet Commonly known as:  LEXAPRO Take 1 tablet (20 mg total) by mouth daily.   estradiol 0.5 MG tablet Commonly known as:  ESTRACE Take 0.5 mg by mouth daily.   progesterone 100 MG  capsule Commonly known as:  PROMETRIUM Take 100 mg by mouth daily.   zolpidem 10 MG tablet Commonly known as:  AMBIEN Take 0.5-1 tablets (5-10 mg total) by mouth at bedtime as needed for sleep.          Objective:   Physical Exam BP 122/66 (BP Location: Left Arm, Patient Position: Sitting, Cuff Size: Normal)   Pulse 67   Temp 98 F (36.7 C) (Oral)   Resp 14   Ht 5\' 7"  (1.702 m)   Wt 166 lb (75.3 kg)   SpO2 98%   BMI 26.00 kg/m  General:   Well developed, well nourished . NAD.  HEENT:  Normocephalic . Face symmetric, atraumatic Neurologic:  alert & oriented X3.  Speech normal, gait appropriate for age and unassisted Psych--  Cognition and judgment appear intact.  Cooperative with normal attention span and concentration.  Behavior appropriate. No anxious or depressed appearing.      Assessment & Plan:    Assessment Anxiety, insomnia Interstitial cystitis - sees urology Bee sting allergies, epi-pen Kidney stones H/O Seizures  Abnormal CT chest, 2014 --> f/u CT  2015 stable, no further CTs BTL  Plan:  Anxiety, insomnia, improved, for now continue leaxpro 20 mg and Ambien as needed. She plans to see a counselor and I think is a great idea. Follow-up in 4-5 months, sooner if needed

## 2016-11-06 NOTE — Patient Instructions (Signed)
Next visit in 4-5 months, sooner if needed

## 2016-11-06 NOTE — Progress Notes (Signed)
Pre visit review using our clinic review tool, if applicable. No additional management support is needed unless otherwise documented below in the visit note. 

## 2016-11-07 NOTE — Assessment & Plan Note (Signed)
Anxiety, insomnia, improved, for now continue leaxpro 20 mg and Ambien as needed. She plans to see a counselor and I think is a great idea. Follow-up in 4-5 months, sooner if needed

## 2016-11-26 ENCOUNTER — Encounter: Payer: Self-pay | Admitting: Internal Medicine

## 2017-01-23 ENCOUNTER — Other Ambulatory Visit: Payer: Self-pay | Admitting: Internal Medicine

## 2017-02-27 ENCOUNTER — Other Ambulatory Visit: Payer: Self-pay | Admitting: Internal Medicine

## 2017-03-17 ENCOUNTER — Encounter: Payer: Self-pay | Admitting: Internal Medicine

## 2017-03-17 ENCOUNTER — Ambulatory Visit (INDEPENDENT_AMBULATORY_CARE_PROVIDER_SITE_OTHER): Payer: Commercial Managed Care - PPO | Admitting: Internal Medicine

## 2017-03-17 VITALS — BP 116/68 | HR 72 | Temp 97.6°F | Resp 14 | Ht 67.0 in | Wt 170.4 lb

## 2017-03-17 DIAGNOSIS — G4700 Insomnia, unspecified: Secondary | ICD-10-CM | POA: Diagnosis not present

## 2017-03-17 DIAGNOSIS — F419 Anxiety disorder, unspecified: Secondary | ICD-10-CM | POA: Diagnosis not present

## 2017-03-17 MED ORDER — ESCITALOPRAM OXALATE 10 MG PO TABS
15.0000 mg | ORAL_TABLET | Freq: Every day | ORAL | 6 refills | Status: DC
Start: 2017-03-17 — End: 2017-04-15

## 2017-03-17 NOTE — Progress Notes (Signed)
Pre visit review using our clinic review tool, if applicable. No additional management support is needed unless otherwise documented below in the visit note. 

## 2017-03-17 NOTE — Progress Notes (Signed)
Subjective:    Patient ID: Annette Woods, female    DOB: 1960/09/05, 56 y.o.   MRN: [id number]  DOS:  03/17/2017 Type of visit - description : rov Interval history: Since the last visit, she is taking Lexapro 20 mg. Emotionally he feels better although she still has  stress, at this time due to being the caregiver for her mother. Her husband is better, has a new job.   Review of Systems Sleeping well, uses Ambien very rarely Since we increased Lexapro she is slightly more tired than  usual, wonders if we could decrease the dose. No suicidal ideas  Past Medical History:  Diagnosis Date  . Abnormal CT scan, chest 01/06/2013  . Anxiety   . Hemorrhoids   . Interstitial cystitis   . Nephrolithiasis   . SEIZURES, HX OF 12/17/2006   Qualifier: History of  By: Cletus Gash MD, [place]      Past Surgical History:  Procedure Laterality Date  . endometiral ablation    . kidney stone extraction     06-2012  . TUBAL LIGATION      Social History   Social History  . Marital status: Married    Spouse name: N/A  . Number of children: 1  . Years of education: N/A   Occupational History  . Teacher, private school, 5th grade     Social History Main Topics  . Smoking status: Never Smoker  . Smokeless tobacco: Never Used  . Alcohol use No  . Drug use: No  . Sexual activity: Not on file   Other Topics Concern  . Not on file   Social History Narrative   Moved to [place] 2008 from [facility]       Allergies as of 03/17/2017      Reactions   Penicillins Anaphylaxis   Bee Venom Swelling      Medication List       Accurate as of 03/17/17 11:59 PM. Always use your most recent med list.          ACIDOPHILUS PROBIOTIC 10 MG Tabs Take 10 mg by mouth 3 (three) times daily.   EPINEPHrine 0.3 mg/0.3 mL Soaj injection Commonly known as:  EPIPEN 2-PAK Inject 0.3 mLs (0.3 mg total) into the skin once as needed.   escitalopram 10 MG tablet Commonly known as:  LEXAPRO Take 1.5 tablets (15 mg  total) by mouth daily.   estradiol 0.5 MG tablet Commonly known as:  ESTRACE Take 0.5 mg by mouth daily.   progesterone 100 MG capsule Commonly known as:  PROMETRIUM Take 100 mg by mouth daily.   zolpidem 10 MG tablet Commonly known as:  AMBIEN Take 0.5-1 tablets (5-10 mg total) by mouth at bedtime as needed for sleep.          Objective:   Physical Exam BP 116/68 (BP Location: Left Arm, Patient Position: Sitting, Cuff Size: Small)   Pulse 72   Temp 97.6 F (36.4 C) (Oral)   Resp 14   Ht 5\' 7"  (1.702 m)   Wt 170 lb 6 oz (77.3 kg)   SpO2 98%   BMI 26.68 kg/m  General:   Well developed, well nourished . NAD.  HEENT:  Normocephalic . Face symmetric, atraumatic Skin: Not pale. Not jaundice Neurologic:  alert & oriented X3.  Speech normal, gait appropriate for age and unassisted Psych--  Cognition and judgment appear intact.  Cooperative with normal attention span and concentration.  Behavior appropriate. No anxious or depressed appearing.  Assessment & Plan:   Assessment Anxiety, insomnia Interstitial cystitis - sees urology Bee sting allergies, epi-pen Kidney stones H/O Seizures  Abnormal CT chest, 2014 --> f/u CT 2015 stable, no further CTs BTL  Plan:  Anxiety, insomnia: Well-controlled with Lexapro 20 mg and sporadic use of Ambien. We agreed to decrease Lexapro to 15 mg, if she feels very well she can drop the dose to 10 mg qd if so desired. And the same time, okay to go back on 20 mg if needed. Continue Ambien prn Declined a flu shot RTC 09/2017 CPX

## 2017-03-17 NOTE — Patient Instructions (Signed)
Decrease Lexapro to 10 mg tablet: 1.5 tablets every day  Okay to decrease to one tablet daily if  you feel very well but that is optional.  If the lower dose is not working please let me know  Next visit for a physical exam 09/2017.

## 2017-03-18 NOTE — Assessment & Plan Note (Signed)
Anxiety, insomnia: Well-controlled with Lexapro 20 mg and sporadic use of Ambien. We agreed to decrease Lexapro to 15 mg, if she feels very well she can drop the dose to 10 mg qd if so desired. And the same time, okay to go back on 20 mg if needed. Continue Ambien prn Declined a flu shot RTC 09/2017 CPX

## 2017-04-15 ENCOUNTER — Other Ambulatory Visit: Payer: Self-pay

## 2017-04-15 MED ORDER — ESCITALOPRAM OXALATE 10 MG PO TABS: 15.0000 mg | ORAL_TABLET | Freq: Every day | ORAL | 1 refills | Status: DC

## 2017-06-25 LAB — HM PAP SMEAR

## 2017-06-25 LAB — HM MAMMOGRAPHY

## 2017-07-03 ENCOUNTER — Ambulatory Visit (INDEPENDENT_AMBULATORY_CARE_PROVIDER_SITE_OTHER): Payer: PRIVATE HEALTH INSURANCE | Admitting: Family Medicine

## 2017-07-03 ENCOUNTER — Encounter: Payer: Self-pay | Admitting: Family Medicine

## 2017-07-03 VITALS — BP 106/68 | HR 87 | Temp 99.5°F | Ht 67.0 in | Wt 167.2 lb

## 2017-07-03 DIAGNOSIS — R6889 Other general symptoms and signs: Secondary | ICD-10-CM | POA: Diagnosis not present

## 2017-07-03 MED ORDER — OSELTAMIVIR PHOSPHATE 75 MG PO CAPS: 75.0000 mg | ORAL_CAPSULE | Freq: Two times a day (BID) | ORAL | 0 refills | Status: AC

## 2017-07-03 NOTE — Progress Notes (Signed)
Pre visit review using our clinic review tool, if applicable. No additional management support is needed unless otherwise documented below in the visit note. 

## 2017-07-03 NOTE — Patient Instructions (Addendum)
Continue to push fluids, practice good hand hygiene, and cover your mouth if you cough.  If you start having increasing fevers, shaking or shortness of breath, seek immediate care.  Ibuprofen 400-600 mg (2-3 over the counter strength tabs) every 6 hours as needed for pain.  OK to take Tylenol 1000 mg (2 extra strength tabs) or 975 mg (3 regular strength tabs) every 6 hours as needed.  Let us know if you need anything.  

## 2017-07-03 NOTE — Progress Notes (Signed)
Chief Complaint  Patient presents with  . Headache  . Cough  . Generalized Body Aches  . Fever    Annette Woods here for URI complaints.   Duration: 2 days  Associated symptoms: sinus headache, sinus congestion, rhinorrhea, ear pain, sore throat, myalgia, cough and fevers high 99's.  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 Abnormal CT scan, chest 01/06/2013  . Anxiety   . Hemorrhoids   . Interstitial cystitis   . Nephrolithiasis   . SEIZURES, HX OF 12/17/2006   Qualifier: History of  By: Cletus Gash MD, [place]     Family History  Problem Relation Age of Onset  . Lung cancer Father        smoker   . Heart attack Father        F age 32  . Diabetes Other        GF, GM?  Marland Kitchen Breast cancer Neg Hx   . Colon cancer Neg Hx   . Stroke Neg Hx     BP 106/68 (BP Location: Left Arm, Patient Position: Sitting, Cuff Size: Normal)   Pulse 87   Temp 99.5 F (37.5 C) (Oral)   Ht 5\' 7"  (1.702 m)   Wt 167 lb 4 oz (75.9 kg)   SpO2 98%   BMI 26.20 kg/m  General: Awake, alert, appears stated age HEENT: AT, , ears patent b/l and TM's neg, nares patent w/o discharge, pharynx pink and without exudates, MMM Neck: No masses or asymmetry Heart: RRR Lungs: CTAB, no accessory muscle use Psych: Age appropriate judgment and insight, normal mood and affect  Flu-like symptoms - Plan: oseltamivir (TAMIFLU) 75 MG capsule  Orders as above. Continue to push fluids, practice good hand hygiene, cover mouth when coughing. F/u prn. If starting to experience fevers, shaking, or shortness of breath, seek immediate care. Pt voiced understanding and agreement to the plan.  [place], DO 07/03/17 4:12 PM

## 2017-07-07 ENCOUNTER — Encounter: Payer: Self-pay | Admitting: Internal Medicine

## 2017-07-07 ENCOUNTER — Ambulatory Visit (HOSPITAL_BASED_OUTPATIENT_CLINIC_OR_DEPARTMENT_OTHER)
Admission: RE | Admit: 2017-07-07 | Discharge: 2017-07-07 | Disposition: A | Discharge status: Home / Self Care | Payer: PRIVATE HEALTH INSURANCE | Source: Ambulatory Visit | Attending: Internal Medicine | Admitting: Internal Medicine

## 2017-07-07 ENCOUNTER — Ambulatory Visit (INDEPENDENT_AMBULATORY_CARE_PROVIDER_SITE_OTHER): Payer: PRIVATE HEALTH INSURANCE | Admitting: Internal Medicine

## 2017-07-07 VITALS — BP 124/68 | HR 68 | Temp 98.2°F | Resp 14 | Ht 67.0 in | Wt 165.2 lb

## 2017-07-07 DIAGNOSIS — R05 Cough: Secondary | ICD-10-CM | POA: Insufficient documentation

## 2017-07-07 DIAGNOSIS — J029 Acute pharyngitis, unspecified: Secondary | ICD-10-CM | POA: Diagnosis not present

## 2017-07-07 DIAGNOSIS — R059 Cough, unspecified: Secondary | ICD-10-CM

## 2017-07-07 DIAGNOSIS — J111 Influenza due to unidentified influenza virus with other respiratory manifestations: Secondary | ICD-10-CM

## 2017-07-07 LAB — POCT RAPID STREP A (OFFICE): Rapid Strep A Screen: NEGATIVE

## 2017-07-07 MED ORDER — HYDROCODONE-HOMATROPINE 5-1.5 MG/5ML PO SYRP: 5.0000 mL | ORAL_SOLUTION | Freq: Three times a day (TID) | ORAL | 0 refills | Status: DC | PRN

## 2017-07-07 MED ORDER — AZITHROMYCIN 250 MG PO TABS
ORAL_TABLET | ORAL | 0 refills | Status: DC
Start: 2017-07-07 — End: 2017-10-29

## 2017-07-07 NOTE — Patient Instructions (Signed)
Get your x-rays downstairs  Continue with good hydration  For cough: Mucinex DM  Tylenol  500 mg OTC 2 tabs a day every 8 hours as needed for pain  You can also use the hydrocodone liquid  for pain and cough , twice a day, will cause drowsiness  Take antibiotics:  Zithromax  If you are not gradually improving in the next 2-3 days please let me know.  If you get worse, specifically have increasing sore throat,  Have a hard time drinking fluids, or eating: Please go to the ER.

## 2017-07-07 NOTE — Assessment & Plan Note (Addendum)
  Influenza: Initially diagnosed with influenza a few days ago, aches and fever decreased but now she has severe cough, yellow sputum and severe throat with evidence of pharyngitis. Rapid strep test negative. Culture sent . Plan: Finished Tamiflu.  Bacterial flu complication (PNM? sinusitis?): check a CXR, r/o PNM, throat culture, Zithromax, hydrocodone as needed, prompt re-eval if not improving.  Keep hydration going.

## 2017-07-07 NOTE — Progress Notes (Signed)
pPre visit review using our clinic review tool, if applicable. No additional management support is needed unless otherwise documented below in the visit note.

## 2017-07-07 NOTE — Progress Notes (Signed)
Subjective:    Patient ID: Annette Woods, female    DOB: 1960/06/19, 57 y.o.   MRN: [id number]  DOS:  07/07/2017 Type of visit - description : Acute visit Interval history: Symptoms started 07/01/2017, was seen here 2 days later with sinus headache, sinus congestion, ear pain, sore throat, myalgias and fevers. Was diagnosed with influenza and prescribed Tamiflu. Since that day, myalgias and fever are resolved however  the sore throat is much worse. Also has severe cough, + chest congestion with a small amount of yellow sputum. She is feeling weak although  she is keeping herself hydrated and able to eat soft foods because eating solids or coughing irritate her throat more. Currently taking Tamiflu and  Advil.   Review of Systems Mild nausea, no vomiting or diarrhea. No rash Mild headache No chest pain or shortness of breath.  Past Medical History:  Diagnosis Date  . Abnormal CT scan, chest 01/06/2013  . Anxiety   . Hemorrhoids   . Interstitial cystitis   . Nephrolithiasis   . SEIZURES, HX OF 12/17/2006   Qualifier: History of  By: Cletus Gash MD, [place]      Past Surgical History:  Procedure Laterality Date  . endometiral ablation    . kidney stone extraction     06-2012  . TUBAL LIGATION      Social History   Socioeconomic History  . Marital status: Married    Spouse name: Not on file  . Number of children: 1  . Years of education: Not on file  . Highest education level: Not on file  Social Needs  . Financial resource strain: Not on file  . Food insecurity - worry: Not on file  . Food insecurity - inability: Not on file  . Transportation needs - medical: Not on file  . Transportation needs - non-medical: Not on file  Occupational History  . Occupation: Pharmacist, hospital, private school, 5th grade   Tobacco Use  . Smoking status: Never Smoker  . Smokeless tobacco: Never Used  Substance and Sexual Activity  . Alcohol use: No  . Drug use: No  . Sexual activity: Not on file    Other Topics Concern  . Not on file  Social History Narrative   Moved to [place] 2008 from [facility]       Allergies as of 07/07/2017      Reactions   Penicillins Anaphylaxis   Bee Venom Swelling      Medication List        Accurate as of 07/07/17  6:16 PM. Always use your most recent med list.          ACIDOPHILUS PROBIOTIC 10 MG Tabs Take 10 mg by mouth 3 (three) times daily.   azithromycin 250 MG tablet Commonly known as:  ZITHROMAX Z-PAK 2 tabs a day the first day, then 1 tab a day x 4 days   EPINEPHrine 0.3 mg/0.3 mL Soaj injection Commonly known as:  EPIPEN 2-PAK Inject 0.3 mLs (0.3 mg total) into the skin once as needed.   escitalopram 10 MG tablet Commonly known as:  LEXAPRO Take 1.5 tablets (15 mg total) daily by mouth.   estradiol 0.5 MG tablet Commonly known as:  ESTRACE Take 0.5 mg by mouth daily.   HYDROcodone-homatropine 5-1.5 MG/5ML syrup Commonly known as:  HYCODAN Take 5 mLs by mouth 3 (three) times daily as needed for cough.   oseltamivir 75 MG capsule Commonly known as:  TAMIFLU Take 1 capsule (75 mg total) by  mouth 2 (two) times daily for 5 days.   progesterone 100 MG capsule Commonly known as:  PROMETRIUM Take 100 mg by mouth daily.   zolpidem 10 MG tablet Commonly known as:  AMBIEN Take 0.5-1 tablets (5-10 mg total) by mouth at bedtime as needed for sleep.          Objective:   Physical Exam BP 124/68 (BP Location: Right Arm, Patient Position: Sitting, Cuff Size: Small)   Pulse 68   Temp 98.2 F (36.8 C) (Oral)   Resp 14   Ht 5\' 7"  (1.702 m)   Wt 165 lb 4 oz (75 kg)   SpO2 95%   BMI 25.88 kg/m  General:   Well developed, well nourished, looks tired but toxic and in no acute distress HEENT:  Normocephalic . Face symmetric, atraumatic TMs: Both are bulging but no red. Throat: Definitely red, no white patches, uvula midline, mildly swelling but symmetric.  No trismus or stridor noted.  No difficulty breathing, no drooling. Neck:  Few small LADs, symmetric. Lungs:  CTA B Normal respiratory effort, no intercostal retractions, no accessory muscle use. Heart: RRR,  no murmur.  No pretibial edema bilaterally  Skin: Not pale. Not jaundice Neurologic:  alert & oriented X3.  Speech normal, gait appropriate for age and unassisted Psych--  Cognition and judgment appear intact.  Cooperative with normal attention span and concentration.  Behavior appropriate. No anxious or depressed appearing.      Assessment & Plan:   Assessment Anxiety, insomnia Interstitial cystitis - sees urology Bee sting allergies, epi-pen Kidney stones H/O Seizures  Abnormal CT chest, 2014 --> f/u CT 2015 stable, no further CTs BTL  Plan:  Influenza: Initially diagnosed with influenza a few days ago, aches and fever decreased but now she has severe cough, yellow sputum and severe throat with evidence of pharyngitis. Rapid strep test negative. Culture sent . Plan: Finished Tamiflu.  Bacterial flu complication (PNM? sinusitis?): check a CXR, r/o PNM, throat culture, Zithromax, hydrocodone as needed, prompt re-eval if not improving.  Keep hydration going.

## 2017-07-08 LAB — CULTURE, GROUP A STREP
MICRO NUMBER:: 90179777
SPECIMEN QUALITY: ADEQUATE

## 2017-10-15 ENCOUNTER — Encounter: Payer: Commercial Managed Care - PPO | Admitting: Internal Medicine

## 2017-10-17 ENCOUNTER — Other Ambulatory Visit: Payer: Self-pay | Admitting: Internal Medicine

## 2017-10-29 ENCOUNTER — Ambulatory Visit (INDEPENDENT_AMBULATORY_CARE_PROVIDER_SITE_OTHER): Payer: PRIVATE HEALTH INSURANCE | Admitting: Internal Medicine

## 2017-10-29 ENCOUNTER — Encounter: Payer: Self-pay | Admitting: Internal Medicine

## 2017-10-29 VITALS — BP 110/64 | HR 60 | Temp 98.3°F | Resp 16 | Ht 67.0 in | Wt 164.0 lb

## 2017-10-29 DIAGNOSIS — Z0000 Encounter for general adult medical examination without abnormal findings: Secondary | ICD-10-CM | POA: Diagnosis not present

## 2017-10-29 NOTE — Patient Instructions (Signed)
  GO TO THE FRONT DESK Schedule your next appointment for a  Physical exam in 1 year   Schedule labs to be done fasting this week   Drink fluids  Metamucil capsules 1 or 2 a day with breakfast  Align or similar probiotic 1 tablet daily  Call if constipation not gradually improving

## 2017-10-29 NOTE — Progress Notes (Signed)
Subjective:    Patient ID: Annette Woods, female    DOB: 03/23/1961, 57 y.o.   MRN: [id number]  DOS:  10/29/2017 Type of visit - description : CPX Interval history: Since the last office visit she is doing well. Her son got married, for a while she exercised a little more and lost some weight. Also for the last 2 months has noticed some constipation, described as her stools are smaller and somewhat dry. Specifically denies nausea, vomiting, blood in the stools or abdominal pain. Anxiety is better, taking less Lexapro  Wt Readings from Last 3 Encounters:  10/29/17 164 lb (74.4 kg)  07/07/17 165 lb 4 oz (75 kg)  07/03/17 167 lb 4 oz (75.9 kg)    Review of Systems  Other than above, a 14 point review of systems is negative      Past Medical History:  Diagnosis Date  . Abnormal CT scan, chest 01/06/2013  . Anxiety   . Hemorrhoids   . Interstitial cystitis   . Nephrolithiasis   . SEIZURES, HX OF 12/17/2006   Qualifier: History of  By: Cletus Gash MD, [place]      Past Surgical History:  Procedure Laterality Date  . endometiral ablation    . kidney stone extraction     06-2012  . TUBAL LIGATION      Social History   Socioeconomic History  . Marital status: Married    Spouse name: Not on file  . Number of children: 1  . Years of education: Not on file  . Highest education level: Not on file  Occupational History  . Occupation: Pharmacist, hospital, private school, 5th grade   Social Needs  . Financial resource strain: Not on file  . Food insecurity:    Worry: Not on file    Inability: Not on file  . Transportation needs:    Medical: Not on file    Non-medical: Not on file  Tobacco Use  . Smoking status: Never Smoker  . Smokeless tobacco: Never Used  Substance and Sexual Activity  . Alcohol use: No  . Drug use: No  . Sexual activity: Not on file  Lifestyle  . Physical activity:    Days per week: Not on file    Minutes per session: Not on file  . Stress: Not on file    Relationships  . Social connections:    Talks on phone: Not on file    Gets together: Not on file    Attends religious service: Not on file    Active member of club or organization: Not on file    Attends meetings of clubs or organizations: Not on file    Relationship status: Not on file  . Intimate partner violence:    Fear of current or ex partner: Not on file    Emotionally abused: Not on file    Physically abused: Not on file    Forced sexual activity: Not on file  Other Topics Concern  . Not on file  Social History Narrative   Moved to [place] 2008 from [facility]      Family History  Problem Relation Age of Onset  . Lung cancer Father        smoker   . Heart attack Father 62  . Diabetes Other        GF, GM?  Marland Kitchen Breast cancer Neg Hx   . Colon cancer Neg Hx   . Stroke Neg Hx      Allergies as of  10/29/2017      Reactions   Penicillins Anaphylaxis   Bee Venom Swelling      Medication List        Accurate as of 10/29/17 11:59 PM. Always use your most recent med list.          ALIGN 4 MG Caps Take by mouth.   EPINEPHrine 0.3 mg/0.3 mL Soaj injection Commonly known as:  EPIPEN 2-PAK Inject 0.3 mLs (0.3 mg total) into the skin once as needed.   escitalopram 10 MG tablet Commonly known as:  LEXAPRO Take 10 mg by mouth daily.   estradiol 0.5 MG tablet Commonly known as:  ESTRACE Take 0.5 mg by mouth daily.   progesterone 100 MG capsule Commonly known as:  PROMETRIUM Take 100 mg by mouth daily.   zolpidem 10 MG tablet Commonly known as:  AMBIEN Take 0.5-1 tablets (5-10 mg total) by mouth at bedtime as needed for sleep.          Objective:   Physical Exam BP 110/64 (BP Location: Right Arm, Patient Position: Sitting, Cuff Size: Small)   Pulse 60   Temp 98.3 F (36.8 C) (Oral)   Resp 16   Ht 5\' 7"  (1.702 m)   Wt 164 lb (74.4 kg)   SpO2 98%   BMI 25.69 kg/m  General:   Well developed, well nourished . NAD.  Neck: No  thyromegaly  HEENT:  Normocephalic  . Face symmetric, atraumatic Lungs:  CTA B Normal respiratory effort, no intercostal retractions, no accessory muscle use. Heart: RRR,  no murmur.  No pretibial edema bilaterally  Abdomen:  Not distended, soft, non-tender. No rebound or rigidity.   Skin: A number of freckles in the upper chest, none seem darker, irregular or worrisome. Neurologic:  alert & oriented X3.  Speech normal, gait appropriate for age and unassisted Strength symmetric and appropriate for age.  Psych: Cognition and judgment appear intact.  Cooperative with normal attention span and concentration.  Behavior appropriate. No anxious or depressed appearing.     Assessment & Plan:   Assessment Anxiety, insomnia Interstitial cystitis - sees urology Bee sting allergies, epi-pen Kidney stones H/O Seizures  Abnormal CT chest, 2014 --> f/u CT 2015 stable, no further CTs BTL  Plan:   Anxiety, insomnia: Currently doing very well, self decreased Lexapro to 10 mg daily and feeling good.  RF as needed. Freckles:   and a number of other skin lesions, warning signs discussed, avoid excessive sun exposure. Constipation: Mild constipation without red flags, recent colonoscopy okay.  Recommend increase fluid intakes, take 1 or 2 Metamucil capsules daily, restart probiotic, call if no better. RTC 1 year, sooner if needed

## 2017-10-29 NOTE — Assessment & Plan Note (Signed)
--  Tdap 2013; shingrex   Not available -- Cscope-- 11-2011, 1 small polyp, Cscope 10/2016, next 5 years per GI --Female care per,   Dr Tressia Danas , has MMG and PAPs  @ gyn . No records, patient aware --diet and exercise discussed. --Labs: Will come back fasting for a CMP, FLP, CBC, vitamin D

## 2017-10-30 ENCOUNTER — Other Ambulatory Visit (INDEPENDENT_AMBULATORY_CARE_PROVIDER_SITE_OTHER): Payer: PRIVATE HEALTH INSURANCE

## 2017-10-30 DIAGNOSIS — Z0000 Encounter for general adult medical examination without abnormal findings: Secondary | ICD-10-CM | POA: Diagnosis not present

## 2017-10-30 LAB — LIPID PANEL
CHOLESTEROL: 220 mg/dL — AB (ref 0–200)
HDL: 49 mg/dL (ref >39.00)
LDL Cholesterol: 139 mg/dL — ABNORMAL HIGH (ref 0–99)
NonHDL: 171.4
Total CHOL/HDL Ratio: 4
Triglycerides: 162 mg/dL — ABNORMAL HIGH (ref 0.0–149.0)
VLDL: 32.4 mg/dL (ref 0.0–40.0)

## 2017-10-30 LAB — COMPREHENSIVE METABOLIC PANEL
ALBUMIN: 3.9 g/dL (ref 3.5–5.2)
ALT: 17 U/L (ref 0–35)
AST: 16 U/L (ref 0–37)
Alkaline Phosphatase: 84 U/L (ref 39–117)
BUN: 11 mg/dL (ref 6–23)
CO2: 24 mEq/L (ref 19–32)
Calcium: 9.2 mg/dL (ref 8.4–10.5)
Chloride: 104 mEq/L (ref 96–112)
Creatinine, Ser: 0.68 mg/dL (ref 0.40–1.20)
GFR: 94.77 mL/min (ref >60.00)
Glucose, Bld: 100 mg/dL — ABNORMAL HIGH (ref 70–99)
POTASSIUM: 4.2 meq/L (ref 3.5–5.1)
Sodium: 137 mEq/L (ref 135–145)
Total Bilirubin: 0.5 mg/dL (ref 0.2–1.2)
Total Protein: 6.4 g/dL (ref 6.0–8.3)

## 2017-10-30 LAB — CBC
HCT: 42.2 % (ref 36.0–46.0)
Hemoglobin: 14.4 g/dL (ref 12.0–15.0)
MCHC: 34.1 g/dL (ref 30.0–36.0)
MCV: 90.7 fl (ref 78.0–100.0)
Platelets: 238 10*3/uL (ref 150.0–400.0)
RBC: 4.65 Mil/uL (ref 3.87–5.11)
RDW: 13.4 % (ref 11.5–15.5)
WBC: 6.1 10*3/uL (ref 4.0–10.5)

## 2017-10-30 NOTE — Assessment & Plan Note (Signed)
Plan:   Anxiety, insomnia: Currently doing very well, self decreased Lexapro to 10 mg daily and feeling good.  RF as needed. Freckles:   and a number of other skin lesions, warning signs discussed, avoid excessive sun exposure. Constipation: Mild constipation without red flags, recent colonoscopy okay.  Recommend increase fluid intakes, take 1 or 2 Metamucil capsules daily, restart probiotic, call if no better. RTC 1 year, sooner if needed

## 2017-11-02 LAB — VITAMIN D 1,25 DIHYDROXY
VITAMIN D3 1, 25 (OH): 47 pg/mL
Vitamin D 1, 25 (OH)2 Total: 47 pg/mL (ref 18–72)
Vitamin D2 1, 25 (OH)2: <8 pg/mL

## 2017-11-25 IMAGING — CT CT ABD-PELV W/ CM
2 of 5 series · 16 of 46 positions shown, 18 images · IV contrast (APPLIED)
Comparison: Abdominal CT dated 06/19/2012

CLINICAL DATA: 55-year-old female with diarrhea and lower abdominal
pain.

EXAM:
CT ABDOMEN AND PELVIS WITH CONTRAST
TECHNIQUE: Multidetector CT imaging of the abdomen and pelvis was performed
using the standard protocol following bolus administration of
intravenous contrast.
CONTRAST:  100mL [id number] IOPAMIDOL ([id number]) INJECTION 61%

[Series 2: axial st · axial · 0.84mm/px · z∈[-482,-62]mm · 13 of 94 slices shown, 15 images]
[im 5/94  soft-tissue]
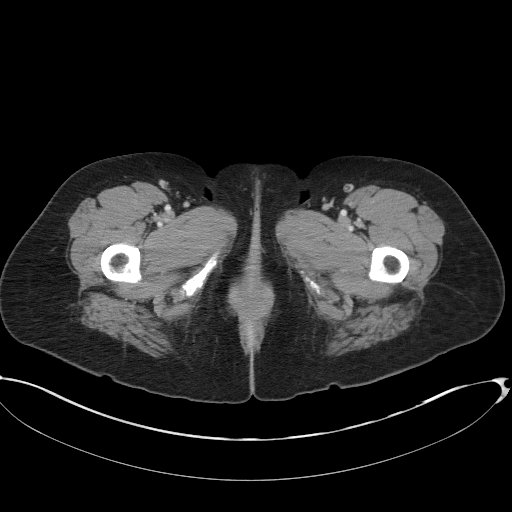
[im 5/94  bone]
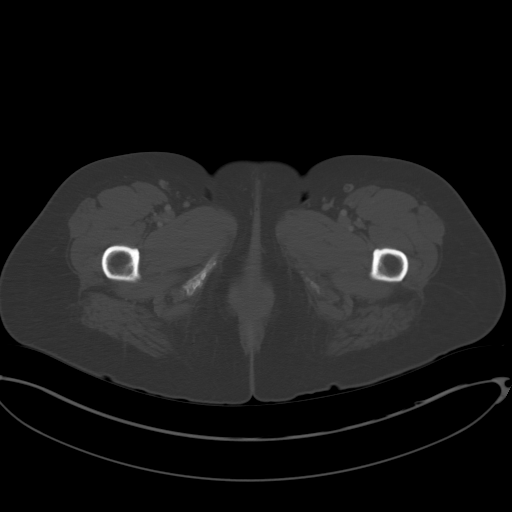
[im 15/94  soft-tissue]
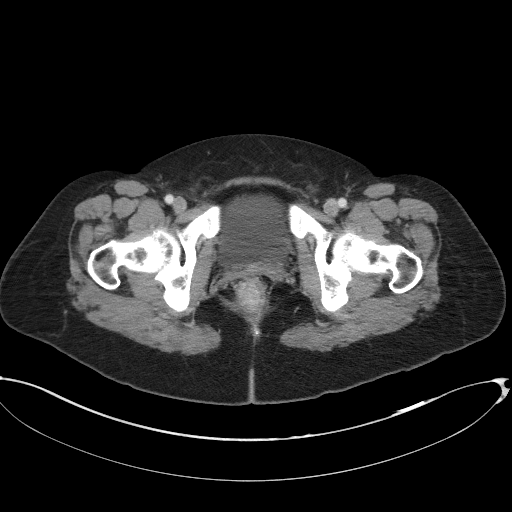
[im 20/94  soft-tissue]
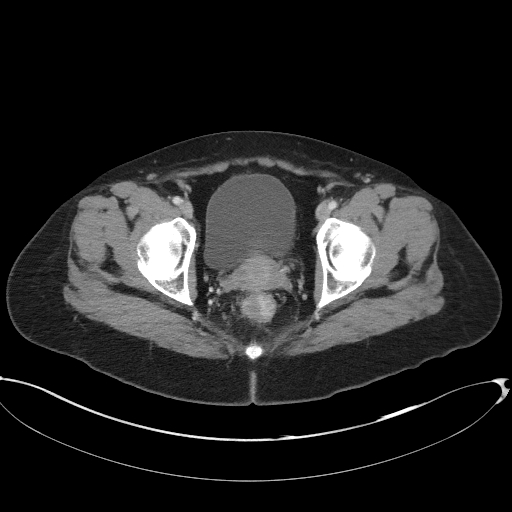
[im 25/94  soft-tissue]
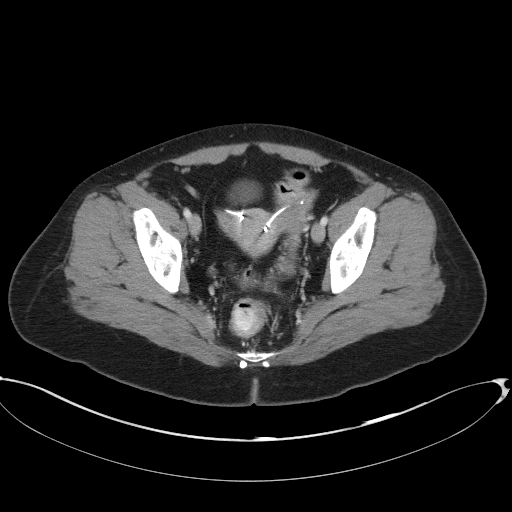
[im 35/94  soft-tissue]
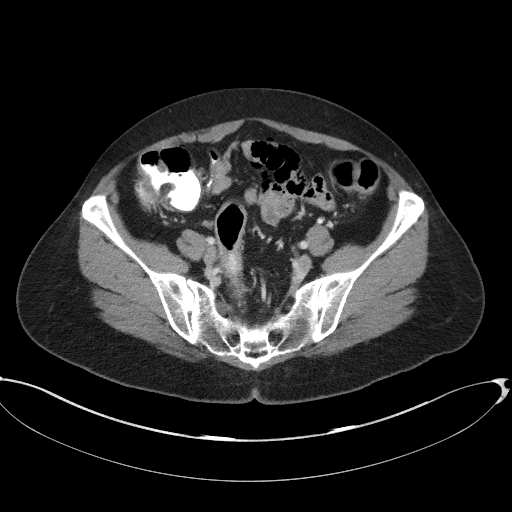
[im 40/94  soft-tissue]
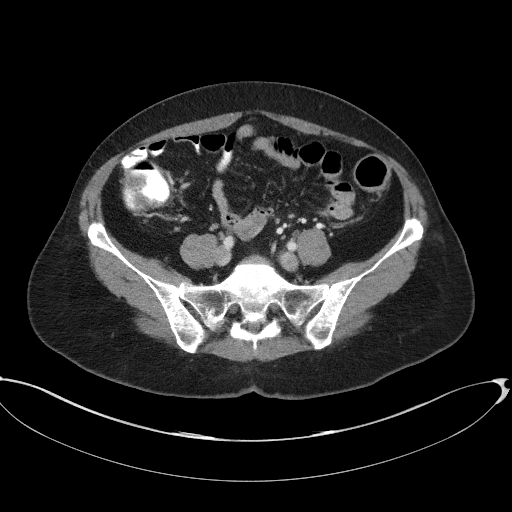
[im 49/94  soft-tissue]
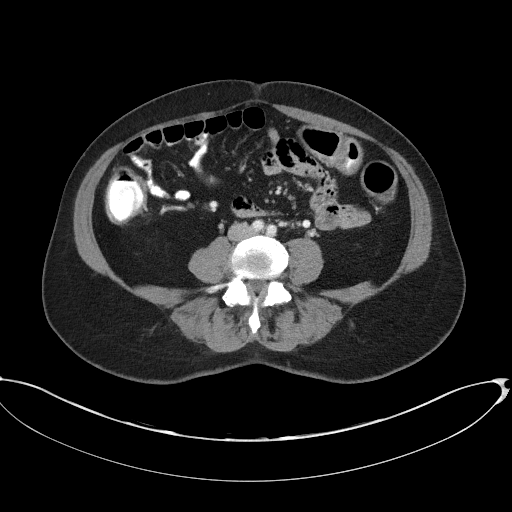
[im 54/94  soft-tissue]
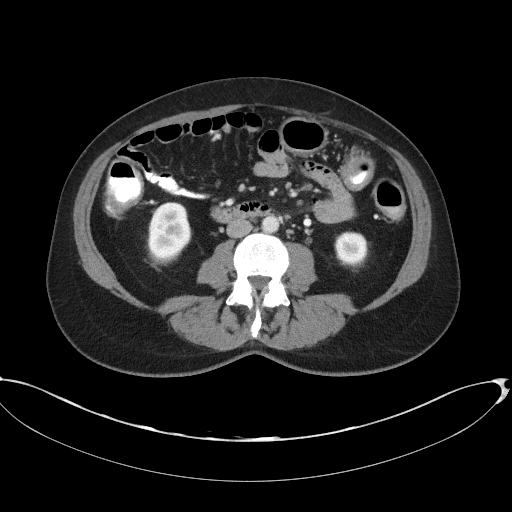
[im 59/94  soft-tissue]
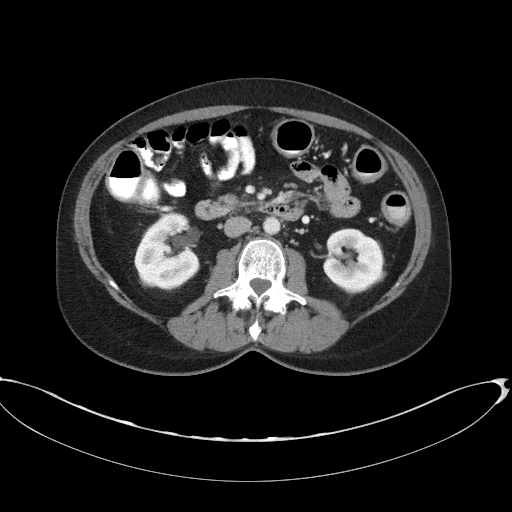
[im 59/94  bone]
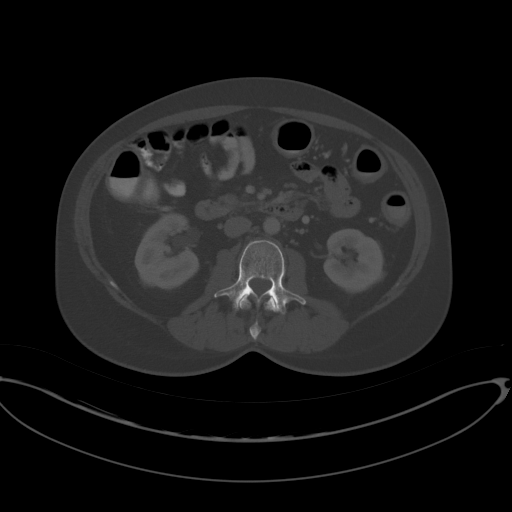
[im 69/94  soft-tissue]
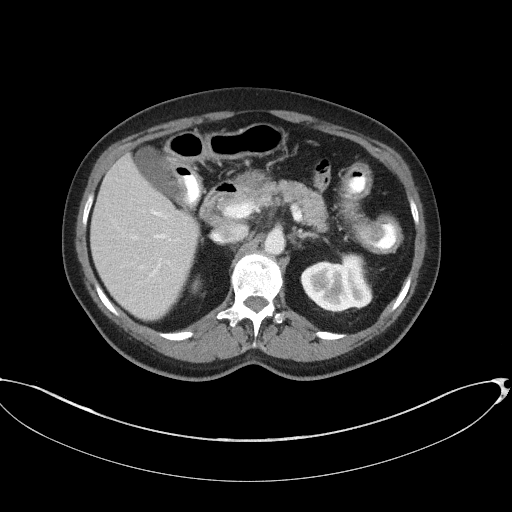
[im 74/94  soft-tissue]
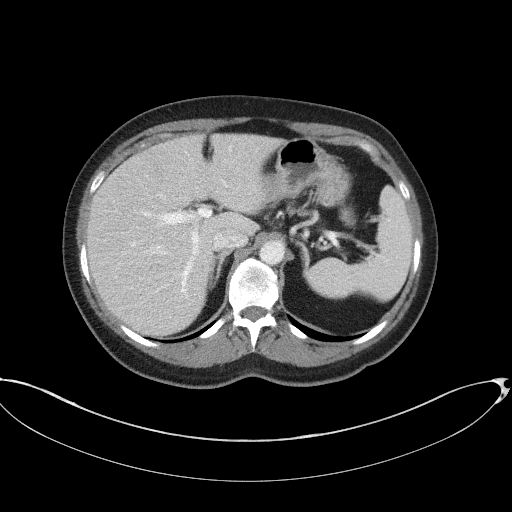
[im 79/94  soft-tissue]
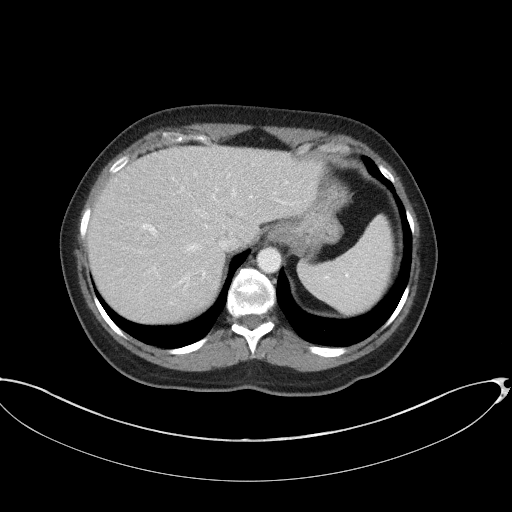
[im 89/94  soft-tissue]
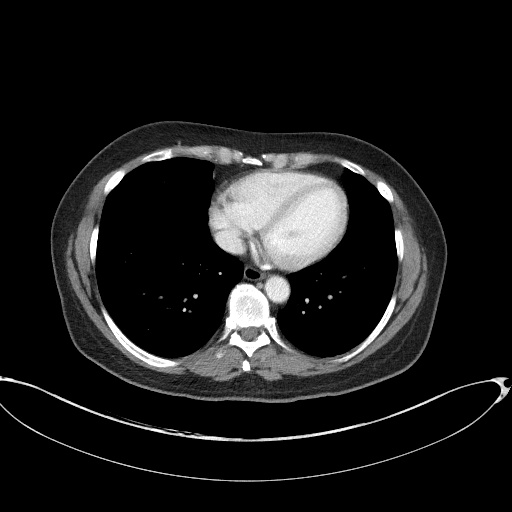

[Series 5: coronal st · coronal · 0.90mm/px · 3 of 80 slices shown]
[im 27/80  soft-tissue]
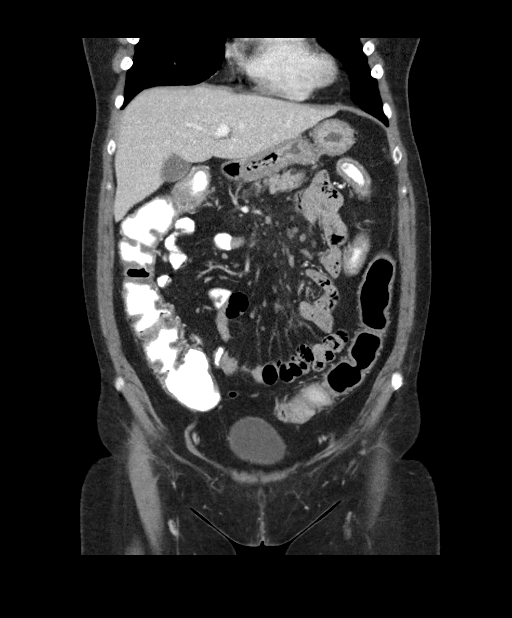
[im 36/80  soft-tissue]
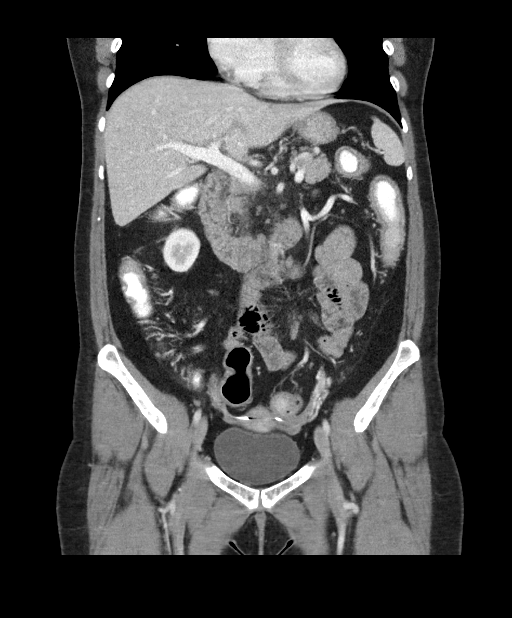
[im 44/80  soft-tissue]
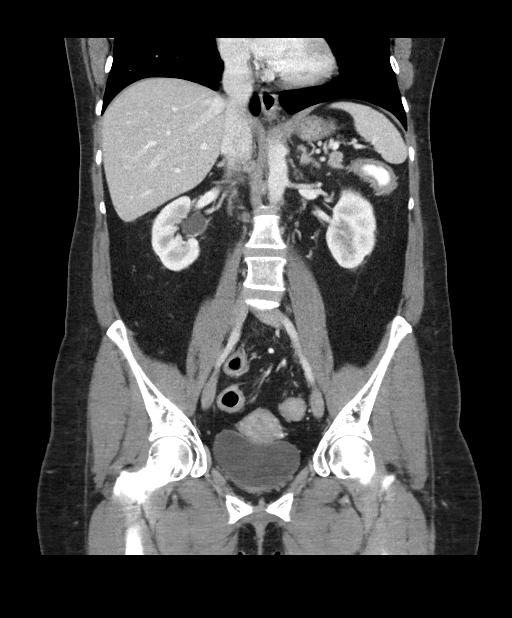

[16 of 46 positions shown; findings below may reference images not displayed]

FINDINGS: Lower chest: The visualized lung bases are clear.

No intra-abdominal free air or free fluid.

Hepatobiliary: No focal liver abnormality is seen. No gallstones,
gallbladder wall thickening, or biliary dilatation.

Pancreas: Unremarkable. No pancreatic ductal dilatation or
surrounding inflammatory changes.

Spleen: Normal in size without focal abnormality.

Adrenals/Urinary Tract: The adrenal glands are unremarkable. There
is a nonobstructing 2 mm left renal interpolar stone. No
hydronephrosis. The right kidney is unremarkable. The visualized
ureters and urinary bladder appear unremarkable as well.

Stomach/Bowel: There is inflammatory changes and thickening of the
colon. There may be mild involvement of the rectum as well. Findings
may be infectious in etiology or related to an underlying
inflammatory bowel disease such as ulcerative colitis. Correlation
with clinical exam and stool cultures and history of inflammatory
bowel disease recommended. There is no evidence of bowel
obstruction. Normal appendix.

Vascular/Lymphatic: The abdominal aorta and IVC appear unremarkable.
The origins of the celiac axis, SMA, IMA as well as the origins of
the renal arteries are patent. The SMV, splenic vein, and main
portal vein are patent. No portal venous gas identified. Small
scattered mesenteric lymph nodes noted there is mild stranding of
the mesentery. There is no adenopathy.

Reproductive: The uterus is anteverted and grossly unremarkable.
Bilateral tubal occlusive devices noted. The ovaries appear
unremarkable.

Other: Small fat containing umbilical hernia.

Musculoskeletal: No acute or significant osseous findings.
IMPRESSION: 1. Pancolitis may be infectious in etiology or related to underlying
inflammatory bowel disease such as ulcerative colitis. Correlation
with clinical exam and stool cultures recommended. No bowel
obstruction. Normal appendix.
2. Nonobstructing 2 mm left renal interpolar stone. No
hydronephrosis.

## 2018-05-09 ENCOUNTER — Encounter: Payer: Self-pay | Admitting: Physician Assistant

## 2018-05-09 ENCOUNTER — Ambulatory Visit (INDEPENDENT_AMBULATORY_CARE_PROVIDER_SITE_OTHER): Payer: PRIVATE HEALTH INSURANCE | Admitting: Physician Assistant

## 2018-05-09 VITALS — BP 106/72 | HR 103 | Temp 98.2°F | Ht 67.0 in | Wt 175.8 lb

## 2018-05-09 DIAGNOSIS — B9789 Other viral agents as the cause of diseases classified elsewhere: Secondary | ICD-10-CM

## 2018-05-09 DIAGNOSIS — J069 Acute upper respiratory infection, unspecified: Secondary | ICD-10-CM

## 2018-05-09 DIAGNOSIS — R52 Pain, unspecified: Secondary | ICD-10-CM

## 2018-05-09 LAB — POC INFLUENZA A&B (BINAX/QUICKVUE)
INFLUENZA A, POC: NEGATIVE
Influenza B, POC: NEGATIVE

## 2018-05-09 MED ORDER — BENZONATATE 100 MG PO CAPS: 100.0000 mg | ORAL_CAPSULE | Freq: Two times a day (BID) | ORAL | 0 refills | Status: DC | PRN

## 2018-05-09 MED ORDER — FLUTICASONE PROPIONATE 50 MCG/ACT NA SUSP: 2.0000 | Freq: Every day | NASAL | 0 refills | Status: DC

## 2018-05-09 NOTE — Progress Notes (Signed)
Patient presents to clinic today c/o 4 days of acute onset of dry cough, aches, chills, headache, sore throat and nasal drainage. Unsure of fever. Denies chest pain, SOB. Notes some ear pressure/popping. Denies recent travel. 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 . Lots of increased stressors recently - husband just diagnosed with RCC. Has used Tylenol for aches and headache. Has tried to keep very well-hydrated. Has not had flu shot.   Past Medical History:  Diagnosis Date  . Abnormal CT scan, chest 01/06/2013  . Anxiety   . Hemorrhoids   . Interstitial cystitis   . Nephrolithiasis   . SEIZURES, HX OF 12/17/2006   Qualifier: History of  By: Cletus Gash MD, [place]      Current Outpatient Medications on File Prior to Visit  Medication Sig Dispense Refill  . EPINEPHrine (EPIPEN 2-PAK) 0.3 mg/0.3 mL IJ SOAJ injection Inject 0.3 mLs (0.3 mg total) into the skin once as needed. 2 Device 3  . escitalopram (LEXAPRO) 10 MG tablet Take 10 mg by mouth daily.    Marland Kitchen estradiol (ESTRACE) 0.5 MG tablet Take 0.5 mg by mouth daily.    . progesterone (PROMETRIUM) 100 MG capsule Take 100 mg by mouth daily.    Marland Kitchen zolpidem (AMBIEN) 10 MG tablet Take 0.5-1 tablets (5-10 mg total) by mouth at bedtime as needed for sleep. 30 tablet 0   No current facility-administered medications on file prior to visit.     Allergies  Allergen Reactions  . Penicillins Anaphylaxis  . Bee Venom Swelling    Family History  Problem Relation Age of Onset  . Lung cancer Father        smoker   . Heart attack Father 46  . Diabetes Other        GF, GM?  Marland Kitchen Breast cancer Neg Hx   . Colon cancer Neg Hx   . Stroke Neg Hx     Social History   Socioeconomic History  . Marital status: Married    Spouse name: Not on file  . Number of children: 1  . Years of education: Not on file  . Highest education level: Not on file  Occupational History  . Occupation: Pharmacist, hospital, private school, 5th grade   Social Needs  . Financial  resource strain: Not on file  . Food insecurity:    Worry: Not on file    Inability: Not on file  . Transportation needs:    Medical: Not on file    Non-medical: Not on file  Tobacco Use  . Smoking status: Never Smoker  . Smokeless tobacco: Never Used  Substance and Sexual Activity  . Alcohol use: No  . Drug use: No  . Sexual activity: Not on file  Lifestyle  . Physical activity:    Days per week: Not on file    Minutes per session: Not on file  . Stress: Not on file  Relationships  . Social connections:    Talks on phone: Not on file    Gets together: Not on file    Attends religious service: Not on file    Active member of club or organization: Not on file    Attends meetings of clubs or organizations: Not on file    Relationship status: Not on file  Other Topics Concern  . Not on file  Social History Narrative   Moved to [place] 2008 from [place] - See HPI.  All other ROS are negative.  BP 106/72 (BP Location: Left Arm, Patient Position: Sitting, Cuff Size: Normal)   Pulse (!) 103   Temp 98.2 F (36.8 C) (Oral)   Ht 5\' 7"  (1.702 m)   Wt 175 lb 12 oz (79.7 kg)   SpO2 98%   BMI 27.53 kg/m   Physical Exam Vitals signs reviewed.  Constitutional:      Appearance: She is well-developed.  HENT:     Head: Normocephalic.     Right Ear: Tympanic membrane normal.     Left Ear: Tympanic membrane normal.     Mouth/Throat:     Mouth: Mucous membranes are moist. No oral lesions.     Pharynx: Oropharynx is clear. No pharyngeal swelling, oropharyngeal exudate, posterior oropharyngeal erythema or uvula swelling.     Tonsils: No tonsillar exudate.  Eyes:     Conjunctiva/sclera: Conjunctivae normal.     Pupils: Pupils are equal, round, and reactive to light.  Neck:     Musculoskeletal: Normal range of motion and neck supple.  Cardiovascular:     Rate and Rhythm: Normal rate and regular rhythm.     Heart sounds: Normal heart sounds.  Pulmonary:     Effort:  Pulmonary effort is normal.     Breath sounds: Normal breath sounds.  Neurological:     Mental Status: She is alert.  Psychiatric:        Mood and Affect: Mood normal.    Assessment/Plan: Viral URI with cough Flu swab negative. Afrebril without antipyretic this morning. Start supportive measures reviewed at visit. OTC medications discussed. Rx Flonase and Tessalon. Plan to call patient Monday to check on her.   - fluticasone (FLONASE) 50 MCG/ACT nasal spray; Place 2 sprays into both nostrils daily.  Dispense: 16 g; Refill: 0 - benzonatate (TESSALON) 100 MG capsule; Take 1 capsule (100 mg total) by mouth 2 (two) times daily as needed for cough.  Dispense: 20 capsule; Refill: 0   Leeanne Rio, PA-C

## 2018-05-09 NOTE — Patient Instructions (Addendum)
Please keep well-hydrated and get plenty of rest.  Put a humidifier in the bedroom if you have one.  Start the [place], taking as directed. This will help with sinus pressure/pain. Alternate tylenol and ibuprofen for aches. I have sent in a prescription cough medication that should help you rest.  Symptoms typically are the worst over days 3-5, then start to ease up significantly. I plan on calling you Monday to check on you. Feel free to call my regular office ([place]) at [phone]   Viral Illness, Adult Viruses are tiny germs that can get into a person's body and cause illness. There are many different types of viruses, and they cause many types of illness. Viral illnesses can range from mild to severe. They can affect various parts of the body. Common illnesses that are caused by a virus include colds and the flu. Viral illnesses also include serious conditions such as HIV/AIDS (human immunodeficiency virus/acquired immunodeficiency syndrome). A few viruses have been linked to certain cancers. What are the causes? Many types of viruses can cause illness. Viruses invade cells in your body, multiply, and cause the infected cells to malfunction or die. When the cell dies, it releases more of the virus. When this happens, you develop symptoms of the illness, and the virus continues to spread to other cells. If the virus takes over the function of the cell, it can cause the cell to divide and grow out of control, as is the case when a virus causes cancer. Different viruses get into the body in different ways. You can get a virus by:  Swallowing food or water that is contaminated with the virus.  Breathing in droplets that have been coughed or sneezed into the air by an infected person.  Touching a surface that has been contaminated with the virus and then touching your eyes, nose, or mouth.  Being bitten by an insect or animal that carries the virus.  Having sexual contact with a  person who is infected with the virus.  Being exposed to blood or fluids that contain the virus, either through an open cut or during a transfusion.  If a virus enters your body, your body's defense system (immune system) will try to fight the virus. You may be at higher risk for a viral illness if your immune system is weak. What are the signs or symptoms? Symptoms vary depending on the type of virus and the location of the cells that it invades. Common symptoms of the main types of viral illnesses include: Cold and flu viruses  Fever.  Headache.  Sore throat.  Muscle aches.  Nasal congestion.  Cough. Digestive system (gastrointestinal) viruses  Fever.  Abdominal pain.  Nausea.  Diarrhea. Liver viruses (hepatitis)  Loss of appetite.  Tiredness.  Yellowing of the skin (jaundice). Brain and spinal cord viruses  Fever.  Headache.  Stiff neck.  Nausea and vomiting.  Confusion or sleepiness. Skin viruses  Warts.  Itching.  Rash. Sexually transmitted viruses  Discharge.  Swelling.  Redness.  Rash. How is this treated? Viruses can be difficult to treat because they live within cells. Antibiotic medicines do not treat viruses because these drugs do not get inside cells. Treatment for a viral illness may include:  Resting and drinking plenty of fluids.  Medicines to relieve symptoms. These can include over-the-counter medicine for pain and fever, medicines for cough or congestion, and medicines to relieve diarrhea.  Antiviral medicines. These drugs are available only for certain types of viruses.  They may help reduce flu symptoms if taken early. There are also many antiviral medicines for hepatitis and HIV/AIDS.  Some viral illnesses can be prevented with vaccinations. A common example is the flu shot. Follow these instructions at home: Medicines   Take over-the-counter and prescription medicines only as told by your health care provider.  If you  were prescribed an antiviral medicine, take it as told by your health care provider. Do not stop taking the medicine even if you start to feel better.  Be aware of when antibiotics are needed and when they are not needed. Antibiotics do not treat viruses. If your health care provider thinks that you may have a bacterial infection as well as a viral infection, you may get an antibiotic. ? Do not ask for an antibiotic prescription if you have been diagnosed with a viral illness. That will not make your illness go away faster. ? Frequently taking antibiotics when they are not needed can lead to antibiotic resistance. When this develops, the medicine no longer works against the bacteria that it normally fights. General instructions  Drink enough fluids to keep your urine clear or pale yellow.  Rest as much as possible.  Return to your normal activities as told by your health care provider. Ask your health care provider what activities are safe for you.  Keep all follow-up visits as told by your health care provider. This is important. How is this prevented? Take these actions to reduce your risk of viral infection:  Eat a healthy diet and get enough rest.  Wash your hands often with soap and water. This is especially important when you are in public places. If soap and water are not available, use hand sanitizer.  Avoid close contact with friends and family who have a viral illness.  If you travel to areas where viral gastrointestinal infection is common, avoid drinking water or eating raw food.  Keep your immunizations up to date. Get a flu shot every year as told by your health care provider.  Do not share toothbrushes, nail clippers, razors, or needles with other people.  Always practice safe sex.  Contact a health care provider if:  You have symptoms of a viral illness that do not go away.  Your symptoms come back after going away.  Your symptoms get worse. Get help right away  if:  You have trouble breathing.  You have a severe headache or a stiff neck.  You have severe vomiting or abdominal pain. This information is not intended to replace advice given to you by your health care provider. Make sure you discuss any questions you have with your health care provider. Document Released: 09/22/2015 Document Revised: 10/25/2015 Document Reviewed: 09/22/2015 Elsevier Interactive Patient Education  [employer].

## 2018-05-11 ENCOUNTER — Telehealth: Payer: Self-pay | Admitting: Physician Assistant

## 2018-05-11 NOTE — Telephone Encounter (Signed)
CRM created and ok for PEC to get current symptoms since Saturday clinic

## 2018-05-11 NOTE — Telephone Encounter (Signed)
Please call patient to see how symptoms are today compared to the weekend (seen at Saturday clinic)

## 2018-05-11 NOTE — Telephone Encounter (Signed)
-----   Message from Brunetta Jeans, PA-C sent at 05/09/2018 10:32 AM EST ----- Regarding: Check up on Patient Call to check on Annette Woods to see how she is feeling.

## 2018-05-11 NOTE — Telephone Encounter (Signed)
LMOVM advising patient to call back about status of symptoms since Saturday.

## 2018-05-15 MED ORDER — DOXYCYCLINE HYCLATE 100 MG PO CAPS: 100.0000 mg | ORAL_CAPSULE | Freq: Two times a day (BID) | ORAL | 0 refills | Status: DC

## 2018-05-15 NOTE — Telephone Encounter (Signed)
Left detailed message for patient to let her know medication has been called in.

## 2018-05-15 NOTE — Telephone Encounter (Signed)
I have sent in an antibiotic for her to take as directed for potential bronchitis as at this point things should have leveled off.

## 2018-05-15 NOTE — Telephone Encounter (Signed)
Patient called in and stated her congestion has moved to her chest , her sinuses are clear but she does have yellow green mucus

## 2018-05-15 NOTE — Addendum Note (Signed)
Addended by: Brunetta Jeans on: 05/15/2018 09:30 AM   Modules accepted: Orders

## 2018-06-01 ENCOUNTER — Other Ambulatory Visit: Payer: Self-pay | Admitting: Physician Assistant

## 2018-06-01 DIAGNOSIS — J069 Acute upper respiratory infection, unspecified: Secondary | ICD-10-CM

## 2018-06-01 DIAGNOSIS — B9789 Other viral agents as the cause of diseases classified elsewhere: Principal | ICD-10-CM

## 2018-06-11 ENCOUNTER — Other Ambulatory Visit: Payer: Self-pay | Admitting: Internal Medicine

## 2018-11-03 ENCOUNTER — Other Ambulatory Visit: Payer: Self-pay | Admitting: Internal Medicine

## 2018-11-03 MED ORDER — ESCITALOPRAM OXALATE 10 MG PO TABS: 15.0000 mg | ORAL_TABLET | Freq: Every day | ORAL | 1 refills | Status: DC

## 2018-11-03 NOTE — Telephone Encounter (Signed)
Refill for escitalopram sent.

## 2018-11-04 ENCOUNTER — Encounter: Payer: PRIVATE HEALTH INSURANCE | Admitting: Internal Medicine

## 2018-11-04 ENCOUNTER — Other Ambulatory Visit: Payer: Self-pay | Admitting: Internal Medicine

## 2018-12-07 ENCOUNTER — Telehealth: Payer: Self-pay

## 2018-12-07 NOTE — Telephone Encounter (Signed)
Copied from [place] [id number]. Topic: General - Other >> Dec 07, 2018  1:43 PM Leward Quan A wrote: Reason for CRM: Patient called to say that her son was seen by Dr Larose Kells and sent to go and be tested for the coronavirus. She want to know if she need to have her appointment changed to a virtual visit or can she still come in for her appointment on 12/08/2018. Please call patient with further instructions Ph# [phone]

## 2018-12-07 NOTE — Telephone Encounter (Signed)
Annette Woods- Pt's son Lysbeth Galas is currently being tested for COVID. Pt is scheduled tomorrow for CPE- will need to reschedule- only [facility] and [facility] are allowing cpe virtually.

## 2018-12-07 NOTE — Telephone Encounter (Signed)
recd TE note to cancel/RS, pt son being tested for COVID19, LM for pt to call to [facility]

## 2018-12-08 ENCOUNTER — Encounter: Payer: PRIVATE HEALTH INSURANCE | Admitting: Internal Medicine

## 2018-12-08 NOTE — Telephone Encounter (Signed)
Appt rescheduled

## 2019-01-06 ENCOUNTER — Encounter: Payer: Self-pay | Admitting: Internal Medicine

## 2019-01-06 ENCOUNTER — Ambulatory Visit (INDEPENDENT_AMBULATORY_CARE_PROVIDER_SITE_OTHER): Payer: PRIVATE HEALTH INSURANCE | Admitting: Internal Medicine

## 2019-01-06 ENCOUNTER — Other Ambulatory Visit: Payer: Self-pay

## 2019-01-06 VITALS — BP 115/72 | HR 75 | Temp 97.9°F | Resp 16 | Ht 67.0 in | Wt 175.5 lb

## 2019-01-06 DIAGNOSIS — M791 Myalgia, unspecified site: Secondary | ICD-10-CM | POA: Diagnosis not present

## 2019-01-06 DIAGNOSIS — Z0000 Encounter for general adult medical examination without abnormal findings: Secondary | ICD-10-CM | POA: Diagnosis not present

## 2019-01-06 DIAGNOSIS — F419 Anxiety disorder, unspecified: Secondary | ICD-10-CM

## 2019-01-06 LAB — CBC WITH DIFFERENTIAL/PLATELET
Basophils Absolute: 0 10*3/uL (ref 0.0–0.1)
Basophils Relative: 0.4 % (ref 0.0–3.0)
Eosinophils Absolute: 0.1 10*3/uL (ref 0.0–0.7)
Eosinophils Relative: 1.3 % (ref 0.0–5.0)
HCT: 42.8 % (ref 36.0–46.0)
Hemoglobin: 14.2 g/dL (ref 12.0–15.0)
Lymphocytes Relative: 20.5 % (ref 12.0–46.0)
Lymphs Abs: 1.5 10*3/uL (ref 0.7–4.0)
MCHC: 33.2 g/dL (ref 30.0–36.0)
MCV: 92.3 fl (ref 78.0–100.0)
Monocytes Absolute: 0.7 10*3/uL (ref 0.1–1.0)
Monocytes Relative: 9.3 % (ref 3.0–12.0)
Neutro Abs: 4.9 10*3/uL (ref 1.4–7.7)
Neutrophils Relative %: 68.5 % (ref 43.0–77.0)
Platelets: 265 10*3/uL (ref 150.0–400.0)
RBC: 4.63 Mil/uL (ref 3.87–5.11)
RDW: 13.5 % (ref 11.5–15.5)
WBC: 7.1 10*3/uL (ref 4.0–10.5)

## 2019-01-06 LAB — SEDIMENTATION RATE: Sed Rate: 14 mm/hr (ref 0–30)

## 2019-01-06 LAB — CK: Total CK: 58 U/L (ref 7–177)

## 2019-01-06 LAB — COMPREHENSIVE METABOLIC PANEL
ALT: 11 U/L (ref 0–35)
AST: 15 U/L (ref 0–37)
Albumin: 4.3 g/dL (ref 3.5–5.2)
Alkaline Phosphatase: 94 U/L (ref 39–117)
BUN: 12 mg/dL (ref 6–23)
CO2: 28 mEq/L (ref 19–32)
Calcium: 9.7 mg/dL (ref 8.4–10.5)
Chloride: 103 mEq/L (ref 96–112)
Creatinine, Ser: 0.76 mg/dL (ref 0.40–1.20)
GFR: 78.1 mL/min (ref >60.00)
Glucose, Bld: 74 mg/dL (ref 70–99)
Potassium: 4.2 mEq/L (ref 3.5–5.1)
Sodium: 140 mEq/L (ref 135–145)
Total Bilirubin: 0.4 mg/dL (ref 0.2–1.2)
Total Protein: 6.8 g/dL (ref 6.0–8.3)

## 2019-01-06 LAB — LIPID PANEL
Cholesterol: 210 mg/dL — ABNORMAL HIGH (ref 0–200)
HDL: 52.1 mg/dL (ref >39.00)
NonHDL: 158.04
Total CHOL/HDL Ratio: 4
Triglycerides: 216 mg/dL — ABNORMAL HIGH (ref 0.0–149.0)
VLDL: 43.2 mg/dL — ABNORMAL HIGH (ref 0.0–40.0)

## 2019-01-06 LAB — LDL CHOLESTEROL, DIRECT: Direct LDL: 131 mg/dL

## 2019-01-06 LAB — HEMOGLOBIN A1C: Hgb A1c MFr Bld: 5.8 % (ref 4.6–6.5)

## 2019-01-06 MED ORDER — ESCITALOPRAM OXALATE 20 MG PO TABS: 20.0000 mg | ORAL_TABLET | Freq: Every day | ORAL | 6 refills | Status: DC

## 2019-01-06 NOTE — Progress Notes (Signed)
Subjective:    Patient ID: Annette Woods, female    DOB: [birth year], 58 y.o.   MRN: [id number]  DOS:  01/06/2019 Type of visit - description: CPX In general feeling well except for anxiety.  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  Occasionally has aches and pains at different areas mostly at night, could be her feet, arms, joints. Denies headaches, fever, chills, weight loss, rash. Sleeping okay, hardly ever used uses Ambien.  Review of Systems  Other than above, a 14 point review of systems is negative    Past Medical History:  Diagnosis Date  . Abnormal CT scan, chest 01/06/2013  . Anxiety   . Hemorrhoids   . Interstitial cystitis   . Nephrolithiasis   . SEIZURES, HX OF 12/17/2006   Qualifier: History of  By: Cletus Gash MD, [place]      Past Surgical History:  Procedure Laterality Date  . endometiral ablation    . kidney stone extraction     06-2012  . TUBAL LIGATION      Social History   Socioeconomic History  . Marital status: Married    Spouse name: Not on file  . Number of children: 1  . Years of education: Not on file  . Highest education level: Not on file  Occupational History  . Occupation: Pharmacist, hospital, private school, 5th grade   Social Needs  . Financial resource strain: Not on file  . Food insecurity    Worry: Not on file    Inability: Not on file  . Transportation needs    Medical: Not on file    Non-medical: Not on file  Tobacco Use  . Smoking status: Never Smoker  . Smokeless tobacco: Never Used  Substance and Sexual Activity  . Alcohol use: No  . Drug use: No  . Sexual activity: Not on file  Lifestyle  . Physical activity    Days per week: Not on file    Minutes per session: Not on file  . Stress: Not on file  Relationships  . Social Herbalist on phone: Not on file    Gets together: Not on file    Attends  religious service: Not on file    Active member of club or organization: Not on file    Attends meetings of clubs or organizations: Not on file    Relationship status: Not on file  . Intimate partner violence    Fear of current or ex partner: Not on file    Emotionally abused: Not on file    Physically abused: Not on file    Forced sexual activity: Not on file  Other Topics Concern  . Not on file  Social History Narrative   Moved to [place] 2008 from [facility]      Family History  Problem Relation Age of Onset  . Lung cancer Father        smoker   . Heart attack Father 8  . Diabetes Other        GF, GM?  Marland Kitchen Breast cancer Neg Hx   . Colon cancer Neg Hx   . Stroke Neg Hx      Allergies as of 01/06/2019      Reactions   Penicillins Anaphylaxis   Bee Venom Swelling      Medication List  Accurate as of January 06, 2019 11:59 PM. If you have any questions, ask your nurse or doctor.        STOP taking these medications   benzonatate 100 MG capsule Commonly known as: TESSALON Stopped by: Kathlene November, MD   doxycycline 100 MG capsule Commonly known as: VIBRAMYCIN Stopped by: Kathlene November, MD   fluticasone 50 MCG/ACT nasal spray Commonly known as: FLONASE Stopped by: Kathlene November, MD     TAKE these medications   EPINEPHrine 0.3 mg/0.3 mL Soaj injection Commonly known as: EPI-PEN Inject 0.3 mLs (0.3 mg total) into the skin once as needed for up to 1 dose for anaphylaxis.   escitalopram 20 MG tablet Commonly known as: Lexapro Take 1 tablet (20 mg total) by mouth daily. What changed:   medication strength  how much to take Changed by: Kathlene November, MD   estradiol 0.5 MG tablet Commonly known as: ESTRACE Take 0.5 mg by mouth daily.   progesterone 100 MG capsule Commonly known as: PROMETRIUM Take 100 mg by mouth daily.   zolpidem 10 MG tablet Commonly known as: Ambien Take 0.5-1 tablets (5-10 mg total) by mouth at bedtime as needed for sleep.           Objective:    Physical Exam BP 115/72 (BP Location: Left Arm, Patient Position: Sitting, Cuff Size: Small)   Pulse 75   Temp 97.9 F (36.6 C) (Temporal)   Resp 16   Ht 5\' 7"  (1.702 m)   Wt 175 lb 8 oz (79.6 kg)   SpO2 99%   BMI 27.49 kg/m   General: Well developed, NAD, BMI noted Neck: No  thyromegaly  HEENT:  Normocephalic . Face symmetric, atraumatic Lungs:  CTA B Normal respiratory effort, no intercostal retractions, no accessory muscle use. Heart: RRR,  no murmur.  No pretibial edema bilaterally  Abdomen:  Not distended, soft, non-tender. No rebound or rigidity.   Skin: Exposed areas without rash. Not pale. Not jaundice MSK: Hands and wrists with no synovitis Neurologic:  alert & oriented X3.  Speech normal, gait appropriate for age and unassisted Strength symmetric and appropriate for age.  Psych: Cognition and judgment appear intact.  Cooperative with normal attention span and concentration.  Behavior appropriate. No anxious or depressed appearing.     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  Plan: Stay on Lexapro 20 mg, Rx sent.  Call for Ambien if needed but she is sleeping well so far. Unusual aches or pains: See ROS and physical exam, check sed rate, total CK.  If symptoms escalate she will let me know RTC 1 year, sooner if needed

## 2019-01-06 NOTE — Progress Notes (Signed)
Pre visit review using our clinic review tool, if applicable. No additional management support is needed unless otherwise documented below in the visit note. 

## 2019-01-06 NOTE — Patient Instructions (Addendum)
GO TO THE LAB : Get the blood work     GO TO THE FRONT DESK Schedule your next appointment   for a physical exam in 1 year  Increase escitalopram to 20 mg every day.  A new prescription was sent.  When you need a refill please call the office  If your aches and pains increase or you have fever, chills, headache please let me know

## 2019-01-06 NOTE — Assessment & Plan Note (Signed)
-  Tdap 2013 - shingrex discussed, elected to wait - flu shot recommended  -- Cscope-- 11-2011, 1 small polyp, Cscope 10/2016, next 5 years per GI --Female care per  Dr Tressia Danas, has a schedule OV for 01/2019; MMG and PAPs  @ gyn . No records, patient aware --diet and exercise discussed. --Labs: CMP, FLP, CBC, A1c, sed rate, total CK -Bone health: Patient is menopausal, on HRT, takes multivitamins.  Consider a DEXA in few years or when she stops HRT

## 2019-01-07 NOTE — Assessment & Plan Note (Signed)
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  Plan: Stay on Lexapro 20 mg, Rx sent.  Call for Ambien if needed but she is sleeping well so far. Unusual aches or pains: See ROS and physical exam, check sed rate, total CK.  If symptoms escalate she will let me know RTC 1 year, sooner if needed

## 2019-02-06 ENCOUNTER — Other Ambulatory Visit: Payer: Self-pay | Admitting: Internal Medicine

## 2019-02-11 LAB — CBC AND DIFFERENTIAL: Hemoglobin: 13.2 (ref 12.0–16.0)

## 2019-02-11 LAB — HM MAMMOGRAPHY

## 2019-02-11 LAB — HM DEXA SCAN

## 2019-04-05 ENCOUNTER — Encounter: Payer: Self-pay | Admitting: Internal Medicine

## 2019-07-30 ENCOUNTER — Other Ambulatory Visit: Payer: Self-pay | Admitting: Internal Medicine

## 2019-12-24 ENCOUNTER — Other Ambulatory Visit: Payer: Self-pay | Admitting: Internal Medicine

## 2020-01-07 ENCOUNTER — Encounter: Payer: Self-pay | Admitting: Internal Medicine

## 2020-01-07 ENCOUNTER — Ambulatory Visit (INDEPENDENT_AMBULATORY_CARE_PROVIDER_SITE_OTHER): Payer: PRIVATE HEALTH INSURANCE | Admitting: Internal Medicine

## 2020-01-07 ENCOUNTER — Other Ambulatory Visit: Payer: Self-pay

## 2020-01-07 VITALS — BP 110/70 | HR 77 | Resp 18 | Ht 67.0 in | Wt 175.8 lb

## 2020-01-07 DIAGNOSIS — Z0000 Encounter for general adult medical examination without abnormal findings: Secondary | ICD-10-CM | POA: Diagnosis not present

## 2020-01-07 LAB — CBC WITH DIFFERENTIAL/PLATELET
Basophils Absolute: 0 10*3/uL (ref 0.0–0.1)
Basophils Relative: 0.4 % (ref 0.0–3.0)
Eosinophils Absolute: 0.1 10*3/uL (ref 0.0–0.7)
Eosinophils Relative: 2 % (ref 0.0–5.0)
HCT: 42.2 % (ref 36.0–46.0)
Hemoglobin: 13.9 g/dL (ref 12.0–15.0)
Lymphocytes Relative: 18.1 % (ref 12.0–46.0)
Lymphs Abs: 1.3 10*3/uL (ref 0.7–4.0)
MCHC: 33 g/dL (ref 30.0–36.0)
MCV: 92 fl (ref 78.0–100.0)
Monocytes Absolute: 0.6 10*3/uL (ref 0.1–1.0)
Monocytes Relative: 8.5 % (ref 3.0–12.0)
Neutro Abs: 4.9 10*3/uL (ref 1.4–7.7)
Neutrophils Relative %: 71 % (ref 43.0–77.0)
Platelets: 250 10*3/uL (ref 150.0–400.0)
RBC: 4.59 Mil/uL (ref 3.87–5.11)
RDW: 13.4 % (ref 11.5–15.5)
WBC: 6.9 10*3/uL (ref 4.0–10.5)

## 2020-01-07 LAB — COMPREHENSIVE METABOLIC PANEL
ALT: 9 U/L (ref 0–35)
AST: 15 U/L (ref 0–37)
Albumin: 4 g/dL (ref 3.5–5.2)
Alkaline Phosphatase: 93 U/L (ref 39–117)
BUN: 10 mg/dL (ref 6–23)
CO2: 28 mEq/L (ref 19–32)
Calcium: 9.3 mg/dL (ref 8.4–10.5)
Chloride: 105 mEq/L (ref 96–112)
Creatinine, Ser: 0.84 mg/dL (ref 0.40–1.20)
GFR: 69.34 mL/min (ref >60.00)
Glucose, Bld: 156 mg/dL — ABNORMAL HIGH (ref 70–99)
Potassium: 4.1 mEq/L (ref 3.5–5.1)
Sodium: 140 mEq/L (ref 135–145)
Total Bilirubin: 0.5 mg/dL (ref 0.2–1.2)
Total Protein: 6.5 g/dL (ref 6.0–8.3)

## 2020-01-07 LAB — LIPID PANEL
Cholesterol: 206 mg/dL — ABNORMAL HIGH (ref 0–200)
HDL: 48.2 mg/dL (ref >39.00)
NonHDL: 158.16
Total CHOL/HDL Ratio: 4
Triglycerides: 203 mg/dL — ABNORMAL HIGH (ref 0.0–149.0)
VLDL: 40.6 mg/dL — ABNORMAL HIGH (ref 0.0–40.0)

## 2020-01-07 LAB — HEMOGLOBIN A1C: Hgb A1c MFr Bld: 5.7 % (ref 4.6–6.5)

## 2020-01-07 LAB — LDL CHOLESTEROL, DIRECT: Direct LDL: 113 mg/dL

## 2020-01-07 LAB — TSH: TSH: 1.06 u[IU]/mL (ref 0.35–4.50)

## 2020-01-07 MED ORDER — ESCITALOPRAM OXALATE 20 MG PO TABS: 20.0000 mg | ORAL_TABLET | Freq: Every day | ORAL | 3 refills | Status: DC

## 2020-01-07 NOTE — Progress Notes (Signed)
Subjective:    Patient ID: Annette Woods, female    DOB: October 25, 1960, 59 y.o.   MRN: [id number]  DOS:  01/07/2020 Type of visit - description: CPX Since the last visit, she is doing well. Admits to some back ache, better with yoga and stretching Still has some stress, taking care of her mother who is elderly. Occasionally has pain at the dorsum of the feet, mostly at night.  No swelling, no cramping.    Review of Systems  Other than above, a 14 point review of systems is negative     Past Medical History:  Diagnosis Date  . Abnormal CT scan, chest 01/06/2013  . Anxiety   . Hemorrhoids   . Interstitial cystitis   . Nephrolithiasis   . SEIZURES, HX OF 12/17/2006   Qualifier: History of  By: Cletus Gash MD, [place]      Past Surgical History:  Procedure Laterality Date  . endometiral ablation    . kidney stone extraction     06-2012  . TUBAL LIGATION      Allergies as of 01/07/2020      Reactions   Penicillins Anaphylaxis   Bee Venom Swelling      Medication List       Accurate as of January 07, 2020 11:59 PM. If you have any questions, ask your nurse or doctor.        EPINEPHrine 0.3 mg/0.3 mL Soaj injection Commonly known as: EPI-PEN Inject 0.3 mLs (0.3 mg total) into the skin once as needed for up to 1 dose for anaphylaxis.   escitalopram 20 MG tablet Commonly known as: LEXAPRO Take 1 tablet (20 mg total) by mouth daily.   estradiol 0.5 MG tablet Commonly known as: ESTRACE Take 0.5 mg by mouth daily.   progesterone 100 MG capsule Commonly known as: PROMETRIUM Take 100 mg by mouth daily.   zolpidem 10 MG tablet Commonly known as: Ambien Take 0.5-1 tablets (5-10 mg total) by mouth at bedtime as needed for sleep.          Objective:   Physical Exam BP 110/70 (BP Location: Left Arm, Patient Position: Sitting, Cuff Size: Large)   Pulse 77   Resp 18   Ht 5\' 7"  (1.702 m)   Wt 175 lb 12.8 oz (79.7 kg)   SpO2 97%   BMI 27.53 kg/m  General: Well  developed, NAD, BMI noted Neck: No  thyromegaly  HEENT:  Normocephalic . Face symmetric, atraumatic Lungs:  CTA B Normal respiratory effort, no intercostal retractions, no accessory muscle use. Heart: RRR,  no murmur.  Abdomen:  Not distended, soft, non-tender. No rebound or rigidity.   Lower extremities: no pretibial edema bilaterally. Feet: Symmetric, no swelling, good pedal pulses.  High arch noted Skin: Exposed areas without rash. Not pale. Not jaundice Neurologic:  alert & oriented X3.  Speech normal, gait appropriate for age and unassisted Strength symmetric and appropriate for age.  Psych: Cognition and judgment appear intact.  Cooperative with normal attention span and concentration.  Behavior appropriate. No anxious or depressed appearing.     Assessment    Assessment Anxiety, insomnia Interstitial cystitis - sees urology Bee sting allergies, epi-pen Kidney stones H/O Seizures  Abnormal CT chest, 2014 --> f/u CT 2015 stable, no further CTs Menopausal at age 18 [place]:   Here for CPX H/o covid 11/ 2020. Anxiety insomnia: RF Lexapro, well controlled, rarely takes Ambien. Interstitial cystitis: Due to see urology. Back pain: Sporadic, yoga helps, encouraged  to continue Feet pain: Normal exam, get shoe inserts for high arches? RTC 1 year       This visit occurred during the SARS-CoV-2 public health emergency.  Safety protocols were in place, including screening questions prior to the visit, additional usage of staff PPE, and extensive cleaning of exam room while observing appropriate contact time as indicated for disinfecting solutions.

## 2020-01-07 NOTE — Patient Instructions (Signed)
   GO TO THE LAB : Get the blood work     GO TO THE FRONT DESK, PLEASE SCHEDULE YOUR APPOINTMENTS Come back for a physical exam in 1 year 

## 2020-01-09 ENCOUNTER — Encounter: Payer: Self-pay | Admitting: Internal Medicine

## 2020-01-09 NOTE — Assessment & Plan Note (Signed)
-  Tdap 2013 - shingrex discussed before. - s/p Covid vaccine -- flu shot recommended  -- Cscope-- 11-2011, 1 small polyp, Cscope 10/2016, next 5 years per GI --Female care per  Dr Tressia Danas, . MMG 01-2019 per K PN.   --diet and exercise discussed. --Labs:  CMP, FLP, CBC, A1c, TSH -Bone health: Patient is menopausal, on HRT, takes multivitamins.

## 2020-01-09 NOTE — Assessment & Plan Note (Signed)
Here for CPX H/o covid 11/ 2020. Anxiety insomnia: RF Lexapro, well controlled, rarely takes Ambien. Interstitial cystitis: Due to see urology. Back pain: Sporadic, yoga helps, encouraged to continue Feet pain: Normal exam, get shoe inserts for high arches? RTC 1 year

## 2020-01-13 ENCOUNTER — Ambulatory Visit (INDEPENDENT_AMBULATORY_CARE_PROVIDER_SITE_OTHER): Payer: PRIVATE HEALTH INSURANCE | Admitting: Family Medicine

## 2020-01-13 ENCOUNTER — Encounter: Payer: Self-pay | Admitting: Family Medicine

## 2020-01-13 ENCOUNTER — Other Ambulatory Visit: Payer: Self-pay

## 2020-01-13 VITALS — BP 120/60 | HR 75 | Temp 98.1°F | Resp 18 | Ht 67.0 in | Wt 175.8 lb

## 2020-01-13 DIAGNOSIS — H60392 Other infective otitis externa, left ear: Secondary | ICD-10-CM | POA: Diagnosis not present

## 2020-01-13 DIAGNOSIS — L819 Disorder of pigmentation, unspecified: Secondary | ICD-10-CM | POA: Diagnosis not present

## 2020-01-13 MED ORDER — DOXYCYCLINE HYCLATE 100 MG PO TABS: 100.0000 mg | ORAL_TABLET | Freq: Two times a day (BID) | ORAL | 0 refills | Status: DC

## 2020-01-13 MED ORDER — OFLOXACIN 0.3 % OT SOLN: 10.0000 [drp] | Freq: Every day | OTIC | 0 refills | Status: DC

## 2020-01-13 NOTE — Progress Notes (Signed)
Patient ID: Annette Woods, female    DOB: October 28, 1960  Age: 59 y.o. MRN: [id number]    Subjective:  Subjective  HPI GALILEE PIERRON presents for L ear pain-- -it started on Monday and it was just a pimple but now pain has spreadto inside her ear  She also has irritaion behind R ear from mask  She is also requesting a referral to derm for hyperpigmentation of skin Review of Systems  Constitutional: Negative for appetite change, diaphoresis, fatigue and unexpected weight change.  HENT: Positive for ear pain. Negative for congestion, ear discharge, facial swelling, hearing loss, sinus pressure and sinus pain.   Eyes: Negative for pain, redness and visual disturbance.  Respiratory: Negative for cough, chest tightness, shortness of breath and wheezing.   Cardiovascular: Negative for chest pain, palpitations and leg swelling.  Endocrine: Negative for cold intolerance, heat intolerance, polydipsia, polyphagia and polyuria.  Genitourinary: Negative for difficulty urinating, dysuria and frequency.  Neurological: Negative for dizziness, light-headedness, numbness and headaches.    History Past Medical History:  Diagnosis Date  . Abnormal CT scan, chest 01/06/2013  . Anxiety   . Hemorrhoids   . Interstitial cystitis   . Nephrolithiasis   . SEIZURES, HX OF 12/17/2006   Qualifier: History of  By: Cletus Gash MD, Cassandria Santee      She has a past surgical history that includes kidney stone extraction; endometiral ablation; and Tubal ligation.   Her family history includes Diabetes in an other family member; Heart attack (age of onset: 19) in her father; Lung cancer in her father.She reports that she has never smoked. She has never used smokeless tobacco. She reports that she does not drink alcohol and does not use drugs.  Current Outpatient Medications on File Prior to Visit  Medication Sig Dispense Refill  . escitalopram (LEXAPRO) 20 MG tablet Take 1 tablet (20 mg total) by mouth daily. 90 tablet 3  .  estradiol (ESTRACE) 0.5 MG tablet Take 0.5 mg by mouth daily.    . progesterone (PROMETRIUM) 100 MG capsule Take 100 mg by mouth daily.    Marland Kitchen EPINEPHrine 0.3 mg/0.3 mL IJ SOAJ injection Inject 0.3 mLs (0.3 mg total) into the skin once as needed for up to 1 dose for anaphylaxis. (Patient not taking: Reported on 01/07/2020) 2 each 3  . zolpidem (AMBIEN) 10 MG tablet Take 0.5-1 tablets (5-10 mg total) by mouth at bedtime as needed for sleep. 30 tablet 0   No current facility-administered medications on file prior to visit.     Objective:  Objective  Physical Exam Vitals and nursing note reviewed.  Constitutional:      Appearance: She is well-developed.  HENT:     Head: Normocephalic and atraumatic.     Right Ear: Hearing normal. Tenderness present. No middle ear effusion. There is no impacted cerumen. No foreign body. Tympanic membrane is not injected or erythematous.     Left Ear: Hearing normal. No tenderness.  No middle ear effusion. There is no impacted cerumen. Tympanic membrane is not injected or erythematous.     Ears:   Eyes:     Conjunctiva/sclera: Conjunctivae normal.  Neck:     Thyroid: No thyromegaly.     Vascular: No carotid bruit or JVD.  Cardiovascular:     Rate and Rhythm: Normal rate and regular rhythm.     Heart sounds: Normal heart sounds. No murmur heard.   Pulmonary:     Effort: Pulmonary effort is normal. No respiratory distress.  Breath sounds: Normal breath sounds. No wheezing or rales.  Chest:     Chest wall: No tenderness.  Musculoskeletal:     Cervical back: Normal range of motion and neck supple.  Skin:      Neurological:     Mental Status: She is alert and oriented to person, place, and time.    BP 120/60 (BP Location: Left Arm, Patient Position: Sitting, Cuff Size: Normal)   Pulse 75   Temp 98.1 F (36.7 C) (Oral)   Resp 18   Ht 5\' 7"  (1.702 m)   Wt 175 lb 12.8 oz (79.7 kg)   SpO2 98%   BMI 27.53 kg/m  Wt Readings from Last 3  Encounters:  01/13/20 175 lb 12.8 oz (79.7 kg)  01/07/20 175 lb 12.8 oz (79.7 kg)  01/06/19 175 lb 8 oz (79.6 kg)     Lab Results  Component Value Date   WBC 6.9 01/07/2020   HGB 13.9 01/07/2020   HCT 42.2 01/07/2020   PLT 250.0 01/07/2020   GLUCOSE 156 (H) 01/07/2020   CHOL 206 (H) 01/07/2020   TRIG 203.0 (H) 01/07/2020   HDL 48.20 01/07/2020   LDLDIRECT 113.0 01/07/2020   LDLCALC 139 (H) 10/30/2017   ALT 9 01/07/2020   AST 15 01/07/2020   NA 140 01/07/2020   K 4.1 01/07/2020   CL 105 01/07/2020   CREATININE 0.84 01/07/2020   BUN 10 01/07/2020   CO2 28 01/07/2020   TSH 1.06 01/07/2020   HGBA1C 5.7 01/07/2020    DG Chest 2 View  Result Date: 07/07/2017 CLINICAL DATA:  Cough, fever. EXAM: CHEST  2 VIEW COMPARISON:  CT scan of August 25, 2013. FINDINGS: The heart size and mediastinal contours are within normal limits. Both lungs are clear. No pneumothorax or pleural effusion is noted. The visualized skeletal structures are unremarkable. IMPRESSION: No active cardiopulmonary disease. Electronically Signed   By: Marijo Conception, M.D.   On: 07/07/2017 15:47     Assessment & Plan:  Plan  I am having Sherren Kerns. Holtmeyer start on doxycycline and ofloxacin. I am also having her maintain her progesterone, estradiol, zolpidem, EPINEPHrine, and escitalopram.  Meds ordered this encounter  Medications  . doxycycline (VIBRA-TABS) 100 MG tablet    Sig: Take 1 tablet (100 mg total) by mouth 2 (two) times daily.    Dispense:  20 tablet    Refill:  0  . ofloxacin (FLOXIN OTIC) 0.3 % OTIC solution    Sig: Place 10 drops into the left ear daily.    Dispense:  10 mL    Refill:  0    Problem List Items Addressed This Visit    None    Visit Diagnoses    Other infective acute otitis externa of left ear    -  Primary   Relevant Medications   doxycycline (VIBRA-TABS) 100 MG tablet   ofloxacin (FLOXIN OTIC) 0.3 % OTIC solution   Hyperpigmentation       Relevant Orders   Ambulatory referral  to Dermatology      Follow-up: Return in about 1 week (around 01/20/2020), or if symptoms worsen or fail to improve, for with pcp .  Ann Held, DO

## 2020-01-13 NOTE — Patient Instructions (Signed)
Otitis Externa  Otitis externa is an infection of the outer ear canal. The outer ear canal is the area between the outside of the ear and the eardrum. Otitis externa is sometimes called swimmer's ear. What are the causes? Common causes of this condition include:  Swimming in dirty water.  Moisture in the ear.  An injury to the inside of the ear.  An object stuck in the ear.  A cut or scrape on the outside of the ear. What increases the risk? You are more likely to develop this condition if you go swimming often. What are the signs or symptoms? The first symptom of this condition is often itching in the ear. Later symptoms of the condition include:  Swelling of the ear.  Redness in the ear.  Ear pain. The pain may get worse when you pull on your ear.  Pus coming from the ear. How is this diagnosed? This condition may be diagnosed by examining the ear and testing fluid from the ear for bacteria and funguses. How is this treated? This condition may be treated with:  Antibiotic ear drops. These are often given for 10-14 days.  Medicines to reduce itching and swelling. Follow these instructions at home:  If you were prescribed antibiotic ear drops, use them as told by your health care provider. Do not stop using the antibiotic even if your condition improves.  Take over-the-counter and prescription medicines only as told by your health care provider.  Avoid getting water in your ears as told by your health care provider. This may include avoiding swimming or water sports for a few days.  Keep all follow-up visits as told by your health care provider. This is important. How is this prevented?  Keep your ears dry. Use the corner of a towel to dry your ears after you swim or bathe.  Avoid scratching or putting things in your ear. Doing these things can damage the ear canal or remove the protective wax that lines it, which makes it easier for bacteria and funguses to  grow.  Avoid swimming in lakes, polluted water, or pools that may not have enough chlorine. Contact a health care provider if:  You have a fever.  Your ear is still red, swollen, painful, or draining pus after 3 days.  Your redness, swelling, or pain gets worse.  You have a severe headache.  You have redness, swelling, pain, or tenderness in the area behind your ear. Summary  Otitis externa is an infection of the outer ear canal.  Common causes include swimming in dirty water, moisture in the ear, or a cut or scrape in the ear.  Symptoms include pain, redness, and swelling of the ear.  If you were prescribed antibiotic ear drops, use them as told by your health care provider. Do not stop using the antibiotic even if your condition improves. This information is not intended to replace advice given to you by your health care provider. Make sure you discuss any questions you have with your health care provider. Document Revised: 10/17/2017 Document Reviewed: 10/17/2017 Elsevier Patient Education  2020 [employer] Inc.  

## 2020-01-17 ENCOUNTER — Ambulatory Visit (INDEPENDENT_AMBULATORY_CARE_PROVIDER_SITE_OTHER): Payer: PRIVATE HEALTH INSURANCE | Admitting: Internal Medicine

## 2020-01-17 ENCOUNTER — Encounter: Payer: Self-pay | Admitting: Internal Medicine

## 2020-01-17 ENCOUNTER — Other Ambulatory Visit: Payer: Self-pay

## 2020-01-17 VITALS — BP 119/77 | HR 70 | Temp 97.9°F | Resp 16 | Ht 67.0 in | Wt 177.1 lb

## 2020-01-17 DIAGNOSIS — H60392 Other infective otitis externa, left ear: Secondary | ICD-10-CM

## 2020-01-17 NOTE — Patient Instructions (Signed)
Finish the oral antibiotic as prescribed  Continue the eardrops for 2 or 3 more days then you can stop  For pain take Tylenol  Continue putting the cream for the rash behind your right ear.  If you are not completely back to normal in 10 days to 2 weeks please let us know

## 2020-01-17 NOTE — Progress Notes (Signed)
Subjective:    Patient ID: Annette Woods, female    DOB: Nov 15, 1960, 59 y.o.   MRN: [id number]  DOS:  01/17/2020 Type of visit - description: Acute Saw  Dr. Etter Sjogren 01/13/2020, DX with left otitis, had a rash behind the right ear.  Since then, she is taking the medication as prescribed and the pain of the left ear is not gone but is better.   Review of Systems No fever chills No runny nose, sore throat. No nasal discharge No cough or chest congestion. No TMJ pain.  Past Medical History:  Diagnosis Date  . Abnormal CT scan, chest 01/06/2013  . Anxiety   . Hemorrhoids   . Interstitial cystitis   . Nephrolithiasis   . SEIZURES, HX OF 12/17/2006   Qualifier: History of  By: Cletus Gash MD, [place]      Past Surgical History:  Procedure Laterality Date  . endometiral ablation    . kidney stone extraction     06-2012  . TUBAL LIGATION      Allergies as of 01/17/2020      Reactions   Penicillins Anaphylaxis   Bee Venom Swelling      Medication List       Accurate as of January 17, 2020 11:17 AM. If you have any questions, ask your nurse or doctor.        doxycycline 100 MG tablet Commonly known as: VIBRA-TABS Take 1 tablet (100 mg total) by mouth 2 (two) times daily.   EPINEPHrine 0.3 mg/0.3 mL Soaj injection Commonly known as: EPI-PEN Inject 0.3 mLs (0.3 mg total) into the skin once as needed for up to 1 dose for anaphylaxis.   escitalopram 20 MG tablet Commonly known as: LEXAPRO Take 1 tablet (20 mg total) by mouth daily.   estradiol 0.5 MG tablet Commonly known as: ESTRACE Take 0.5 mg by mouth daily.   ofloxacin 0.3 % OTIC solution Commonly known as: Floxin Otic Place 10 drops into the left ear daily.   progesterone 100 MG capsule Commonly known as: PROMETRIUM Take 100 mg by mouth daily.   zolpidem 10 MG tablet Commonly known as: Ambien Take 0.5-1 tablets (5-10 mg total) by mouth at bedtime as needed for sleep.          Objective:   Physical Exam BP  119/77 (BP Location: Left Arm, Patient Position: Sitting, Cuff Size: Small)   Pulse 70   Temp 97.9 F (36.6 C) (Oral)   Resp 16   Ht 5\' 7"  (1.702 m)   Wt 177 lb 2 oz (80.3 kg)   SpO2 98%   BMI 27.74 kg/m   General:   Well developed, NAD, BMI noted. HEENT:  Normocephalic . Face symmetric, atraumatic R TM: Slightly bulge, rest of the ear is normal L ear: TM is intact, canal  not red, there is an area with minimal bleeding but no discharge, no swelling. Trago sign negative Lungs:  CTA B Normal respiratory effort, no intercostal retractions, no accessory muscle use. Heart: RRR,  no murmur.  Lower extremities: no pretibial edema bilaterally  Skin: Minimal dryness and mild redness behind the right ear. Neurologic:  alert & oriented X3.  Speech normal, gait appropriate for age and unassisted Psych--  Cognition and judgment appear intact.  Cooperative with normal attention span and concentration.  Behavior appropriate. No anxious or depressed appearing.      Assessment     Assessment Anxiety, insomnia Interstitial cystitis - sees urology Bee sting allergies, epi-pen Kidney stones  H/O Seizures  Abnormal CT chest, 2014 --> f/u CT 2015 stable, no further CTs Menopausal at age 70 BTL  Plan:   L otitis externa: Was seen by Dr. Etter Sjogren 4 days ago, L canal was red per chart review, looks better today.  Patient feels better.  Continue antibiotics and eardrops for about 2-3 more days.  Call if not completely back to normal in 10 days to 2 weeks. Rash behind right ear: Improving with topical steroids.   This visit occurred during the SARS-CoV-2 public health emergency.  Safety protocols were in place, including screening questions prior to the visit, additional usage of staff PPE, and extensive cleaning of exam room while observing appropriate contact time as indicated for disinfecting solutions.

## 2020-01-17 NOTE — Progress Notes (Signed)
Pre visit review using our clinic review tool, if applicable. No additional management support is needed unless otherwise documented below in the visit note. 

## 2020-01-17 NOTE — Assessment & Plan Note (Signed)
L otitis externa: Was seen by Dr. Etter Sjogren 4 days ago, L canal was red per chart review, looks better today.  Patient feels better.  Continue antibiotics and eardrops for about 2-3 more days.  Call if not completely back to normal in 10 days to 2 weeks. Rash behind right ear: Improving with topical steroids.

## 2020-03-27 LAB — HM MAMMOGRAPHY

## 2020-08-16 ENCOUNTER — Encounter: Payer: Self-pay | Admitting: Internal Medicine

## 2020-11-03 ENCOUNTER — Ambulatory Visit (HOSPITAL_BASED_OUTPATIENT_CLINIC_OR_DEPARTMENT_OTHER)
Admission: RE | Admit: 2020-11-03 | Discharge: 2020-11-03 | Disposition: A | Discharge status: Home / Self Care | Payer: No Typology Code available for payment source | Source: Ambulatory Visit | Attending: Family | Admitting: Family

## 2020-11-03 ENCOUNTER — Ambulatory Visit (INDEPENDENT_AMBULATORY_CARE_PROVIDER_SITE_OTHER): Payer: PRIVATE HEALTH INSURANCE | Admitting: Family

## 2020-11-03 ENCOUNTER — Other Ambulatory Visit: Payer: Self-pay | Admitting: Family

## 2020-11-03 ENCOUNTER — Other Ambulatory Visit: Payer: Self-pay

## 2020-11-03 VITALS — BP 112/68 | HR 83 | Temp 98.3°F | Resp 16 | Wt 179.8 lb

## 2020-11-03 DIAGNOSIS — R300 Dysuria: Secondary | ICD-10-CM | POA: Diagnosis not present

## 2020-11-03 DIAGNOSIS — R109 Unspecified abdominal pain: Secondary | ICD-10-CM | POA: Insufficient documentation

## 2020-11-03 DIAGNOSIS — N3010 Interstitial cystitis (chronic) without hematuria: Secondary | ICD-10-CM

## 2020-11-03 LAB — POC URINALSYSI DIPSTICK (AUTOMATED)
Bilirubin, UA: NEGATIVE
Blood, UA: NEGATIVE
Glucose, UA: NEGATIVE
Ketones, UA: NEGATIVE
Nitrite, UA: NEGATIVE
Protein, UA: NEGATIVE
Spec Grav, UA: 1.025 (ref 1.010–1.025)
Urobilinogen, UA: 0.2 E.U./dL
pH, UA: 5.5 (ref 5.0–8.0)

## 2020-11-03 MED ORDER — PHENAZOPYRIDINE HCL 200 MG PO TABS: 200.0000 mg | ORAL_TABLET | Freq: Three times a day (TID) | ORAL | 0 refills | Status: DC | PRN

## 2020-11-03 MED ORDER — NITROFURANTOIN MONOHYD MACRO 100 MG PO CAPS: 100.0000 mg | ORAL_CAPSULE | Freq: Two times a day (BID) | ORAL | 0 refills | Status: DC

## 2020-11-03 NOTE — Progress Notes (Signed)
Annette Woods is a 60 y.o. female with the following history as recorded in [employer]:  Patient Active Problem List   Diagnosis Date Noted   PCP NOTES >>>>>>>>>>>>>>>>>> 08/24/2015   Atopic dermatitis 01/06/2013   Abnormal CT scan, chest 01/06/2013   Bee sting allergy 01/06/2013   Internal hemorrhoid 05/18/2012   Annual physical exam 11/11/2011   Anxiety 09/12/2008   Menopause 09/12/2008   INSOMNIA 09/12/2008   CYSTITIS, CHRONIC INTERSTITIAL 12/17/2006   NEPHROLITHIASIS, HX OF 12/17/2006    Current Outpatient Medications  Medication Sig Dispense Refill   EPINEPHrine 0.3 mg/0.3 mL IJ SOAJ injection Inject 0.3 mLs (0.3 mg total) into the skin once as needed for up to 1 dose for anaphylaxis. 2 each 3   escitalopram (LEXAPRO) 20 MG tablet Take 1 tablet (20 mg total) by mouth daily. 90 tablet 3   estradiol (ESTRACE) 0.5 MG tablet Take 0.5 mg by mouth daily.     progesterone (PROMETRIUM) 100 MG capsule Take 100 mg by mouth daily.     nitrofurantoin, macrocrystal-monohydrate, (MACROBID) 100 MG capsule Take 1 capsule (100 mg total) by mouth 2 (two) times daily. 14 capsule 0   ofloxacin (FLOXIN OTIC) 0.3 % OTIC solution Place 10 drops into the left ear daily. (Patient not taking: Reported on 11/03/2020) 10 mL 0   phenazopyridine (PYRIDIUM) 200 MG tablet Take 1 tablet (200 mg total) by mouth 3 (three) times daily as needed for pain. 10 tablet 0   zolpidem (AMBIEN) 10 MG tablet Take 0.5-1 tablets (5-10 mg total) by mouth at bedtime as needed for sleep. 30 tablet 0   No current facility-administered medications for this visit.    Allergies: Penicillins and Bee venom  Past Medical History:  Diagnosis Date   Abnormal CT scan, chest 01/06/2013   Anxiety    Hemorrhoids    Interstitial cystitis    Nephrolithiasis    SEIZURES, HX OF 12/17/2006   Qualifier: History of  By: Cletus Gash MD, Luis      Past Surgical History:  Procedure Laterality Date   endometiral ablation     kidney stone extraction      06-2012   TUBAL LIGATION      Family History  Problem Relation Age of Onset   Lung cancer Father        smoker    Heart attack Father 31   Diabetes Other        GF, GM?   Breast cancer Neg Hx    Colon cancer Neg Hx    Stroke Neg Hx     Social History   Tobacco Use   Smoking status: Never   Smokeless tobacco: Never  Substance Use Topics   Alcohol use: No    Subjective:   Patient presents with concerns for possible UTI; unable to give sample today; does have history of interstitial cystitis but per patient "this feels different." Urinary hesitancy/ sense of incomplete emptying;  No known history of kidney stones; has not been under care of urology for the past 6 years- would be open to establishing with new provider;   Objective:  Vitals:   11/03/20 0918  BP: 112/68  Pulse: 83  Resp: 16  Temp: 98.3 F (36.8 C)  SpO2: 99%  Weight: 179 lb 12.8 oz (81.6 kg)    General: Well developed, well nourished, in no acute distress  Skin : Warm and dry.  Head: Normocephalic and atraumatic  Eyes: Sclera and conjunctiva clear; pupils round and reactive to light; extraocular  movements intact  Ears: External normal; canals clear; tympanic membranes normal  Oropharynx: Pink, supple. No suspicious lesions  Neck: Supple without thyromegaly, adenopathy  Lungs: Respirations unlabored;  Neurologic: Alert and oriented; speech intact; face symmetrical; moves all extremities well; CNII-XII intact without focal deficit   Assessment:  1. Abdominal pain, unspecified abdominal location   2. Dysuria     Plan:  STAT renal stone CT ordered today to rule out obstruction- CT was normal; check U/A and urine culture; Rx for Macrobid and Pyridium have been sent to pharmacy; ? Symptoms related to interstitial cystis; patient has been referred to urogynecology to establish care/ discuss treatment options;   This visit occurred during the SARS-CoV-2 public health emergency.  Safety protocols were in  place, including screening questions prior to the visit, additional usage of staff PPE, and extensive cleaning of exam room while observing appropriate contact time as indicated for disinfecting solutions.    No follow-ups on file.  Orders Placed This Encounter  Procedures   Urine Culture   CT RENAL STONE STUDY    Standing Status:   Future    Number of Occurrences:   1    Standing Expiration Date:   11/03/2021    Order Specific Question:   Is patient pregnant?    Answer:   No    Order Specific Question:   Preferred imaging location?    Answer:   [facility]   POCT Urinalysis Dipstick (Automated)    Requested Prescriptions    No prescriptions requested or ordered in this encounter

## 2020-11-04 LAB — URINE CULTURE
MICRO NUMBER:: 11993722
Result:: NO GROWTH
SPECIMEN QUALITY:: ADEQUATE

## 2020-11-15 ENCOUNTER — Encounter: Payer: Self-pay | Admitting: Internal Medicine

## 2020-11-15 ENCOUNTER — Other Ambulatory Visit: Payer: Self-pay

## 2020-11-15 ENCOUNTER — Ambulatory Visit (INDEPENDENT_AMBULATORY_CARE_PROVIDER_SITE_OTHER): Payer: PRIVATE HEALTH INSURANCE | Admitting: Internal Medicine

## 2020-11-15 VITALS — BP 118/76 | HR 81 | Temp 98.1°F | Resp 16 | Ht 67.0 in | Wt 180.4 lb

## 2020-11-15 DIAGNOSIS — T7840XA Allergy, unspecified, initial encounter: Secondary | ICD-10-CM

## 2020-11-15 MED ORDER — PREDNISONE 10 MG PO TABS: ORAL_TABLET | ORAL | 0 refills | Status: DC

## 2020-11-15 NOTE — Progress Notes (Signed)
Subjective:    Patient ID: Annette Woods, female    DOB: [birth year], 60 y.o.   MRN: [id number]  DOS:  11/15/2020 Type of visit - description: acute  For the last 6 days has noted redness and itching at the anterior upper chest, around the neck, scalp and nuchal area. Denies any blisters or pain. No fever chills. No cough or wheezing. No lip or tongue swelling  She took [place] and Pyridium recently.  Review of Systems See above   Past Medical History:  Diagnosis Date   Abnormal CT scan, chest 01/06/2013   Anxiety    Hemorrhoids    Interstitial cystitis    Nephrolithiasis    SEIZURES, HX OF 12/17/2006   Qualifier: History of  By: Cletus Gash MD, Luis      Past Surgical History:  Procedure Laterality Date   endometiral ablation     kidney stone extraction     06-2012   TUBAL LIGATION      Allergies as of 11/15/2020       Reactions   Penicillins Anaphylaxis   Bee Venom Swelling        Medication List        Accurate as of November 15, 2020 10:21 AM. If you have any questions, ask your nurse or doctor.          EPINEPHrine 0.3 mg/0.3 mL Soaj injection Commonly known as: EPI-PEN Inject 0.3 mLs (0.3 mg total) into the skin once as needed for up to 1 dose for anaphylaxis.   escitalopram 20 MG tablet Commonly known as: LEXAPRO Take 1 tablet (20 mg total) by mouth daily.   estradiol 0.5 MG tablet Commonly known as: ESTRACE Take 0.5 mg by mouth daily.   nitrofurantoin (macrocrystal-monohydrate) 100 MG capsule Commonly known as: Macrobid Take 1 capsule (100 mg total) by mouth 2 (two) times daily.   ofloxacin 0.3 % OTIC solution Commonly known as: Floxin Otic Place 10 drops into the left ear daily.   phenazopyridine 200 MG tablet Commonly known as: Pyridium Take 1 tablet (200 mg total) by mouth 3 (three) times daily as needed for pain.   progesterone 100 MG capsule Commonly known as: PROMETRIUM Take 100 mg by mouth daily.   zolpidem 10 MG  tablet Commonly known as: Ambien Take 0.5-1 tablets (5-10 mg total) by mouth at bedtime as needed for sleep.           Objective:   Physical Exam BP 118/76 (BP Location: Left Arm, Patient Position: Sitting, Cuff Size: Small)   Pulse 81   Temp 98.1 F (36.7 C) (Oral)   Resp 16   Ht 5\' 7"  (1.702 m)   Wt 180 lb 6 oz (81.8 kg)   SpO2 98%   BMI 28.25 kg/m  General:   Well developed, NAD, BMI noted. HEENT:  Normocephalic . Face symmetric, atraumatic Lungs:  CTA B Normal respiratory effort, no intercostal retractions, no accessory muscle use. Heart: RRR,  no murmur.  Lower extremities: no pretibial edema bilaterally  Skin: No obvious rash at the neck, chest, scalp. Neurologic:  alert & oriented X3.  Speech normal, gait appropriate for age and unassisted Psych--  Cognition and judgment appear intact.  Cooperative with normal attention span and concentration.  Behavior appropriate. No anxious or depressed appearing.      Assessment      Assessment Anxiety, insomnia Interstitial cystitis - sees urology Bee sting allergies, epi-pen Kidney stones H/O Seizures  Abnormal CT chest, 2014 --> f/u CT  2015 stable, no further CTs Menopausal at age 74 BTL  Plan:   Allergic reaction: Suspect an allergic reaction, she took Pyridium and Macrobid from 6/10  to 6/12, symptoms started 4 days later as described above. This could be a reaction to those medicines. No systemic symptoms or red flags. Plan: Prednisone, Claritin, hydrocortisone, Benadryl. She is leaving to [place] in a couple of days, see AVS    This visit occurred during the SARS-CoV-2 public health emergency.  Safety protocols were in place, including screening questions prior to the visit, additional usage of staff PPE, and extensive cleaning of exam room while observing appropriate contact time as indicated for disinfecting solutions.

## 2020-11-15 NOTE — Patient Instructions (Signed)
I think you have an allergic reaction, possibly to one of the 2 medications you took few days ago.  Start prednisone for few days, follow the instructions on the bottle  Claritin 10 mg OTC: 1 tablet every morning for few days  If needed, okay to take Benadryl 12.5 mg at bedtime  Okay to use over-the-counter hydrocortisone cream 1%: Apply at night in the areas are itching.  If you are not better, please see a local doctor (I know you will be in [place])

## 2020-11-16 ENCOUNTER — Telehealth: Payer: Self-pay

## 2020-11-16 NOTE — Telephone Encounter (Signed)
Attempt made to contact Annette Woods is a 60 y.o. female re: New Patient appointment with Dr. Wannetta Sender. Pt was not available.  LM on the VM for the patient to call me back.

## 2020-11-16 NOTE — Assessment & Plan Note (Signed)
Allergic reaction: Suspect an allergic reaction, she took Pyridium and Macrobid from 6/10  to 6/12, symptoms started 4 days later as described above. This could be a reaction to those medicines. No systemic symptoms or red flags. Plan: Prednisone, Claritin, hydrocortisone, Benadryl. She is leaving to [place] in a couple of days, see AVS

## 2021-01-08 ENCOUNTER — Encounter: Payer: PRIVATE HEALTH INSURANCE | Admitting: Internal Medicine

## 2021-01-15 ENCOUNTER — Other Ambulatory Visit: Payer: Self-pay

## 2021-01-15 ENCOUNTER — Ambulatory Visit (INDEPENDENT_AMBULATORY_CARE_PROVIDER_SITE_OTHER): Payer: PRIVATE HEALTH INSURANCE | Admitting: Internal Medicine

## 2021-01-15 ENCOUNTER — Encounter: Payer: Self-pay | Admitting: Internal Medicine

## 2021-01-15 VITALS — BP 128/76 | HR 77 | Temp 98.4°F | Resp 16 | Ht 67.0 in | Wt 177.4 lb

## 2021-01-15 DIAGNOSIS — R5383 Other fatigue: Secondary | ICD-10-CM

## 2021-01-15 DIAGNOSIS — F419 Anxiety disorder, unspecified: Secondary | ICD-10-CM

## 2021-01-15 DIAGNOSIS — Z0001 Encounter for general adult medical examination with abnormal findings: Secondary | ICD-10-CM

## 2021-01-15 DIAGNOSIS — R739 Hyperglycemia, unspecified: Secondary | ICD-10-CM | POA: Diagnosis not present

## 2021-01-15 DIAGNOSIS — Z0000 Encounter for general adult medical examination without abnormal findings: Secondary | ICD-10-CM

## 2021-01-15 MED ORDER — ESCITALOPRAM OXALATE 20 MG PO TABS: 20.0000 mg | ORAL_TABLET | Freq: Every day | ORAL | 3 refills | Status: DC

## 2021-01-15 NOTE — Assessment & Plan Note (Signed)
Here for CPX Anxiety, insomnia: On Lexapro, takes Ambien very sporadically, symptoms controlled Interstitial cystitis: Per urology Fatigue: As described above, no cardiopulmonary symptoms, Epworth scale: Mildly positive, she did not know that she snores until last night when she was told by her husband she does.   We discussed referral for sleep study versus wait few months.  We agreed on: Increase exercise, check vitamin levels, monitor snoring  and reassess in 6 months. RTC 5 to 6 months

## 2021-01-15 NOTE — Patient Instructions (Addendum)
Recommend to proceed with the following vaccines at your pharmacy: Flu shot this fall Covid #3 Shingrix   GO TO THE LAB : Get the blood work     [place], [place] back for a check up in 5-6 months    "Living will", "[place] of attorney": Advanced care planning  (If you already have a living will or healthcare power of attorney, please bring the copy to be scanned in your chart.)  Advance care planning is a process that supports adults in  understanding and sharing their preferences regarding future medical care.   The patient's preferences are recorded in documents called Advance Directives.    Advanced directives are completed (and can be modified at any time) while the patient is in full mental capacity.   The documentation should be available at all times to the patient, the family and the healthcare providers.  Bring in a copy to be scanned in your chart is an excellent idea and is recommended   This legal documents direct treatment decision making and/or appoint a surrogate to make the decision if the patient is not capable to do so.    Advance directives can be documented in many types of formats,  documents have names such as:  Lliving will  Durable power of attorney for healthcare (healthcare proxy or healthcare power of attorney)  Combined directives  Physician orders for life-sustaining treatment    More information at:  [email or web address]

## 2021-01-15 NOTE — Progress Notes (Signed)
Subjective:    Patient ID: Annette Woods, female    DOB: 10-16-1960, 60 y.o.   MRN: [id number]  DOS:  01/15/2021 Type of visit - description: CPX Here for CPX, in general feels well.  Has felt tired x a while, she thought that after retirement she would have more energy but that is not the case. Denies any exertional symptoms such as chest pain or DOE. Emotionally doing well. She admits to somewhat sedentary lifestyle. I ask about snoring, last night her husband told her (for the first time)  that she snores.  Wt Readings from Last 3 Encounters:  01/15/21 177 lb 6 oz (80.5 kg)  11/15/20 180 lb 6 oz (81.8 kg)  11/03/20 179 lb 12.8 oz (81.6 kg)      Review of Systems No fever chills, no major fluctuations on her weight No nausea vomiting. No cough or wheezing  Other than above, a 14 point review of systems is negative   Past Medical History:  Diagnosis Date   Abnormal CT scan, chest 01/06/2013   Anxiety    Hemorrhoids    Interstitial cystitis    Nephrolithiasis    SEIZURES, HX OF 12/17/2006   Qualifier: History of  By: Cletus Gash MD, Luis      Past Surgical History:  Procedure Laterality Date   endometiral ablation     kidney stone extraction     06-2012   TUBAL LIGATION     Social History   Socioeconomic History   Marital status: Married    Spouse name: Not on file   Number of children: 1   Years of education: Not on file   Highest education level: Not on file  Occupational History   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, 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  Tobacco Use   Smoking status: Never   Smokeless tobacco: Never  Substance and Sexual Activity   Alcohol use: No   Drug use: No   Sexual activity: Not on file  Other Topics Concern   Not on file  Social History Narrative   Moved to [place] 2008 from [facility]    Her son and his wife living temporarily w/ them   Social Determinants of Health   Financial Resource Strain: Not on file  Food Insecurity: Not on file   Transportation Needs: Not on file  Physical Activity: Not on file  Stress: Not on file  Social Connections: Not on file  Intimate Partner Violence: Not on file     Allergies as of 01/15/2021       Reactions   Penicillins Anaphylaxis   Bee Venom Swelling   Macrobid [nitrofurantoin] Itching   See OV note 11-15-2020   Pyridium [phenazopyridine] Itching   See OV note 11-15-2020        Medication List        Accurate as of January 15, 2021  8:43 PM. If you have any questions, ask your nurse or doctor.          STOP taking these medications    ofloxacin 0.3 % OTIC solution Commonly known as: Floxin Otic Stopped by: Kathlene November, MD   predniSONE 10 MG tablet Commonly known as: DELTASONE Stopped by: Kathlene November, MD       TAKE these medications    EPINEPHrine 0.3 mg/0.3 mL Soaj injection Commonly known as: EPI-PEN Inject 0.3 mLs (0.3 mg total) into the skin once as needed for up to 1 dose for anaphylaxis.   escitalopram 20 MG tablet Commonly known as:  LEXAPRO Take 1 tablet (20 mg total) by mouth daily.   estradiol 0.5 MG tablet Commonly known as: ESTRACE Take 0.5 mg by mouth daily.   progesterone 100 MG capsule Commonly known as: PROMETRIUM Take 100 mg by mouth daily.   zolpidem 10 MG tablet Commonly known as: Ambien Take 0.5-1 tablets (5-10 mg total) by mouth at bedtime as needed for sleep.           Objective:   Physical Exam BP 128/76 (BP Location: Left Arm, Patient Position: Sitting, Cuff Size: Small)   Pulse 77   Temp 98.4 F (36.9 C) (Oral)   Resp 16   Ht '5\' 7"'$  (1.702 m)   Wt 177 lb 6 oz (80.5 kg)   SpO2 98%   BMI 27.78 kg/m  General: Well developed, NAD, BMI noted Neck: No  thyromegaly  HEENT:  Normocephalic . Face symmetric, atraumatic Lungs:  CTA B Normal respiratory effort, no intercostal retractions, no accessory muscle use. Heart: RRR,  no murmur.  Abdomen:  Not distended, soft, non-tender. No rebound or rigidity.   Lower  extremities: no pretibial edema bilaterally  Skin: Exposed areas without rash. Not pale. Not jaundice Neurologic:  alert & oriented X3.  Speech normal, gait appropriate for age and unassisted Strength symmetric and appropriate for age.  Psych: Cognition and judgment appear intact.  Cooperative with normal attention span and concentration.  Behavior appropriate. No anxious or depressed appearing.     Assessment    Assessment Anxiety, insomnia Interstitial cystitis - sees urology Bee sting allergies, epi-pen Kidney stones H/O Seizures  Abnormal CT chest, 2014 --> f/u CT 2015 stable, no further CTs Menopausal at age 10 BTL  PLAN Here for CPX Anxiety, insomnia: On Lexapro, takes Ambien very sporadically, symptoms controlled Interstitial cystitis: Per urology Fatigue: As described above, no cardiopulmonary symptoms, Epworth scale: Mildly positive, she did not know that she snores until last night when she was told by her husband she does.   We discussed referral for sleep study versus wait few months.  We agreed on: Increase exercise, check vitamin levels, monitor snoring  and reassess in 6 months. RTC 5 to 6 months  This visit occurred during the SARS-CoV-2 public health emergency.  Safety protocols were in place, including screening questions prior to the visit, additional usage of staff PPE, and extensive cleaning of exam room while observing appropriate contact time as indicated for disinfecting solutions.

## 2021-01-15 NOTE — Assessment & Plan Note (Signed)
-  Tdap 2013 - shingrex: Recommended - s/p Covid vaccine x2, recommend boosters -- flu shot every fall recommended. -- Cscope-- 11-2011, 1 small polyp, Cscope 10/2016, next 5 years per GI --Female care per  Dr Runell Gess, . [facility] 03/2020 -- K PN.   --diet and exercise discussed. --Labs:   CMP, FLP, CBC, TSH, 123456, 123456, folic acid -- DEXA normal, 01-2019: normal; on  HRT, takes multivitamins.   --POA: Discussed

## 2021-01-16 LAB — COMPREHENSIVE METABOLIC PANEL
ALT: 8 U/L (ref 0–35)
AST: 14 U/L (ref 0–37)
Albumin: 4 g/dL (ref 3.5–5.2)
Alkaline Phosphatase: 79 U/L (ref 39–117)
BUN: 11 mg/dL (ref 6–23)
CO2: 26 mEq/L (ref 19–32)
Calcium: 9.4 mg/dL (ref 8.4–10.5)
Chloride: 103 mEq/L (ref 96–112)
Creatinine, Ser: 0.72 mg/dL (ref 0.40–1.20)
GFR: 90.98 mL/min (ref >60.00)
Glucose, Bld: 100 mg/dL — ABNORMAL HIGH (ref 70–99)
Potassium: 4 mEq/L (ref 3.5–5.1)
Sodium: 137 mEq/L (ref 135–145)
Total Bilirubin: 0.5 mg/dL (ref 0.2–1.2)
Total Protein: 6.9 g/dL (ref 6.0–8.3)

## 2021-01-16 LAB — LIPID PANEL
Cholesterol: 227 mg/dL — ABNORMAL HIGH (ref 0–200)
HDL: 45.4 mg/dL (ref >39.00)
NonHDL: 181.37
Total CHOL/HDL Ratio: 5
Triglycerides: 285 mg/dL — ABNORMAL HIGH (ref 0.0–149.0)
VLDL: 57 mg/dL — ABNORMAL HIGH (ref 0.0–40.0)

## 2021-01-16 LAB — CBC WITH DIFFERENTIAL/PLATELET
Basophils Absolute: 0 10*3/uL (ref 0.0–0.1)
Basophils Relative: 0.7 % (ref 0.0–3.0)
Eosinophils Absolute: 0.1 10*3/uL (ref 0.0–0.7)
Eosinophils Relative: 2 % (ref 0.0–5.0)
HCT: 42.4 % (ref 36.0–46.0)
Hemoglobin: 14.2 g/dL (ref 12.0–15.0)
Lymphocytes Relative: 16.3 % (ref 12.0–46.0)
Lymphs Abs: 1.1 10*3/uL (ref 0.7–4.0)
MCHC: 33.5 g/dL (ref 30.0–36.0)
MCV: 91.1 fl (ref 78.0–100.0)
Monocytes Absolute: 0.7 10*3/uL (ref 0.1–1.0)
Monocytes Relative: 9.8 % (ref 3.0–12.0)
Neutro Abs: 4.8 10*3/uL (ref 1.4–7.7)
Neutrophils Relative %: 71.2 % (ref 43.0–77.0)
Platelets: 267 10*3/uL (ref 150.0–400.0)
RBC: 4.66 Mil/uL (ref 3.87–5.11)
RDW: 14 % (ref 11.5–15.5)
WBC: 6.7 10*3/uL (ref 4.0–10.5)

## 2021-01-16 LAB — B12 AND FOLATE PANEL
Folate: 16.5 ng/mL (ref >5.9)
Vitamin B-12: 819 pg/mL (ref 211–911)

## 2021-01-16 LAB — LDL CHOLESTEROL, DIRECT: Direct LDL: 140 mg/dL

## 2021-01-16 LAB — TSH: TSH: 0.85 u[IU]/mL (ref 0.35–5.50)

## 2021-01-16 LAB — HEMOGLOBIN A1C: Hgb A1c MFr Bld: 5.8 % (ref 4.6–6.5)

## 2021-04-10 DIAGNOSIS — R339 Retention of urine, unspecified: Secondary | ICD-10-CM | POA: Insufficient documentation

## 2021-04-10 DIAGNOSIS — N9489 Other specified conditions associated with female genital organs and menstrual cycle: Secondary | ICD-10-CM | POA: Insufficient documentation

## 2021-04-27 LAB — HM MAMMOGRAPHY

## 2021-05-01 LAB — HM PAP SMEAR: HM Pap smear: NEGATIVE

## 2021-05-01 LAB — RESULTS CONSOLE HPV: CHL HPV: NEGATIVE

## 2021-06-19 ENCOUNTER — Ambulatory Visit: Payer: PRIVATE HEALTH INSURANCE | Admitting: Internal Medicine

## 2021-07-26 ENCOUNTER — Encounter: Payer: Self-pay | Admitting: Internal Medicine

## 2021-08-30 ENCOUNTER — Encounter: Payer: Self-pay | Admitting: Internal Medicine

## 2021-09-07 ENCOUNTER — Encounter: Payer: Self-pay | Admitting: Family Medicine

## 2021-09-07 ENCOUNTER — Ambulatory Visit (INDEPENDENT_AMBULATORY_CARE_PROVIDER_SITE_OTHER): Payer: 59 | Admitting: Family Medicine

## 2021-09-07 VITALS — BP 110/80 | HR 71 | Temp 98.5°F | Resp 18 | Ht 67.0 in | Wt 180.8 lb

## 2021-09-07 DIAGNOSIS — H6502 Acute serous otitis media, left ear: Secondary | ICD-10-CM | POA: Diagnosis not present

## 2021-09-07 DIAGNOSIS — H60332 Swimmer's ear, left ear: Secondary | ICD-10-CM | POA: Diagnosis not present

## 2021-09-07 MED ORDER — OFLOXACIN 0.3 % OT SOLN: 10.0000 [drp] | Freq: Every day | OTIC | 0 refills | Status: DC

## 2021-09-07 MED ORDER — DOXYCYCLINE HYCLATE 100 MG PO TABS: 100.0000 mg | ORAL_TABLET | Freq: Two times a day (BID) | ORAL | 0 refills | Status: DC

## 2021-09-07 NOTE — Progress Notes (Signed)
? ?Subjective:  ? ?By signing my name below, I, Zite Okoli, attest that this documentation has been prepared under the direction and in the presence of Ann Held, DO. 09/07/2021  ? ? Patient ID: Annette Woods, female    DOB: 1960-08-19, 61 y.o.   MRN: [id number] ? ?Chief Complaint  ?Patient presents with  ? Ear Pain  ?  Pt states both ears but more left. Pt states having pain and fullness and having eye redness  ? ? ?HPI ?Patient is in today for an office visit. ? ?She reports pain and fullness in her ears with the left one being worse. She was seen previously for ear pain and was diagnosed with swimmer's ear. ? ?She complains of skin irritation behind her right ear. ? ?Past Medical History:  ?Diagnosis Date  ? Abnormal CT scan, chest 01/06/2013  ? Anxiety   ? Hemorrhoids   ? Interstitial cystitis   ? Nephrolithiasis   ? SEIZURES, HX OF 12/17/2006  ? Qualifier: History of  By: Cletus Gash MD, [place]    ? ? ?Past Surgical History:  ?Procedure Laterality Date  ? endometiral ablation    ? kidney stone extraction    ? 06-2012  ? TUBAL LIGATION    ? ? ?Family History  ?Problem Relation Age of Onset  ? Lung cancer Father   ?     smoker   ? Heart attack Father 61  ? Diabetes Other   ?     GF, GM?  ? Breast cancer Neg Hx   ? Colon cancer Neg Hx   ? Stroke Neg Hx   ? ? ?Social History  ? ?Socioeconomic History  ? Marital status: Married  ?  Spouse name: Not on file  ? Number of children: 1  ? Years of education: Not on file  ? Highest education level: Not on file  ?Occupational History  ? 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 Smoking status: Never  ? Smokeless tobacco: Never  ?Substance and Sexual Activity  ? Alcohol use: No  ? Drug use: No  ? Sexual activity: Not on file  ?Other Topics Concern  ? Not on file  ?Social History Narrative  ? Moved to [place] 2008 from [facility]   ? Her son and his wife living temporarily w/ them  ? ?Social Determinants of Health  ? ?Financial Resource Strain:  Not on file  ?Food Insecurity: Not on file  ?Transportation Needs: Not on file  ?Physical Activity: Not on file  ?Stress: Not on file  ?Social Connections: Not on file  ?Intimate Partner Violence: Not on file  ? ? ?Outpatient Medications Prior to Visit  ?Medication Sig Dispense Refill  ? EPINEPHrine 0.3 mg/0.3 mL IJ SOAJ injection Inject 0.3 mLs (0.3 mg total) into the skin once as needed for up to 1 dose for anaphylaxis. 2 each 3  ? escitalopram (LEXAPRO) 20 MG tablet Take 1 tablet (20 mg total) by mouth daily. 90 tablet 3  ? estradiol (ESTRACE) 0.5 MG tablet Take 0.5 mg by mouth daily.    ? progesterone (PROMETRIUM) 100 MG capsule Take 100 mg by mouth daily.    ? zolpidem (AMBIEN) 10 MG tablet Take 0.5-1 tablets (5-10 mg total) by mouth at bedtime as needed for sleep. 30 tablet 0  ? ?No facility-administered medications prior to visit.  ? ? ?Allergies  ?Allergen Reactions  ? Penicillins Anaphylaxis  ? Bee Venom Swelling  ? Macrobid [Nitrofurantoin]  Itching  ?  See OV note 11-15-2020  ? Pyridium [Phenazopyridine] Itching  ?  See OV note 11-15-2020  ? ? ?Review of Systems  ?Constitutional:  Negative for fever.  ?HENT:  Positive for ear pain. Negative for congestion, hearing loss, sinus pain and sore throat.   ?Eyes:  Negative for blurred vision and pain.  ?Respiratory:  Negative for cough, sputum production, shortness of breath and wheezing.   ?Cardiovascular:  Negative for chest pain and palpitations.  ?Gastrointestinal:  Negative for blood in stool, constipation, diarrhea, nausea and vomiting.  ?Genitourinary:  Negative for dysuria, frequency, hematuria and urgency.  ?Musculoskeletal:  Negative for back pain, falls and myalgias.  ?Skin:  Positive for rash (behind ear).  ?Neurological:  Negative for dizziness, sensory change, loss of consciousness, weakness and headaches.  ?Endo/Heme/Allergies:  Negative for environmental allergies. Does not bruise/bleed easily.  ?Psychiatric/Behavioral:  Negative for depression and  suicidal ideas. The patient is not nervous/anxious and does not have insomnia.   ? ?   ?Objective:  ?  ?Physical Exam ?Vitals and nursing note reviewed.  ?Constitutional:   ?   General: She is not in acute distress. ?   Appearance: Normal appearance. She is not ill-appearing.  ?HENT:  ?   Head: Normocephalic and atraumatic.  ?   Right Ear: External ear normal. Tenderness present. Tympanic membrane is erythematous.  ?   Left Ear: External ear normal. Tenderness present. Tympanic membrane is erythematous.  ?   Ears:  ?   Comments: Yellow fluid inside left ear canal ?Eyes:  ?   Extraocular Movements: Extraocular movements intact.  ?   Pupils: Pupils are equal, round, and reactive to light.  ?Cardiovascular:  ?   Rate and Rhythm: Normal rate and regular rhythm.  ?   Pulses: Normal pulses.  ?   Heart sounds: Normal heart sounds. No murmur heard. ?  No gallop.  ?Pulmonary:  ?   Effort: Pulmonary effort is normal. No respiratory distress.  ?   Breath sounds: Normal breath sounds. No wheezing, rhonchi or rales.  ?Abdominal:  ?   General: Bowel sounds are normal. There is no distension.  ?   Palpations: Abdomen is soft. There is no mass.  ?   Tenderness: There is no abdominal tenderness. There is no guarding or rebound.  ?   Hernia: No hernia is present.  ?Musculoskeletal:  ?   Cervical back: Normal range of motion and neck supple.  ?Lymphadenopathy:  ?   Cervical: No cervical adenopathy.  ?Skin: ?   General: Skin is warm and dry.  ?Neurological:  ?   Mental Status: She is alert and oriented to person, place, and time.  ?Psychiatric:     ?   Behavior: Behavior normal.  ? ? ?BP 110/80 (BP Location: Left Arm, Patient Position: Sitting, Cuff Size: Normal)   Pulse 71   Temp 98.5 ?F (36.9 ?C) (Oral)   Resp 18   Ht '5\' 7"'$  (1.702 m)   Wt 180 lb 12.8 oz (82 kg)   SpO2 99%   BMI 28.32 kg/m?  ?Wt Readings from Last 3 Encounters:  ?09/07/21 180 lb 12.8 oz (82 kg)  ?01/15/21 177 lb 6 oz (80.5 kg)  ?11/15/20 180 lb 6 oz (81.8 kg)   ? ? ?Diabetic Foot Exam - Simple   ?No data filed ?  ? ?Lab Results  ?Component Value Date  ? WBC 6.7 01/15/2021  ? HGB 14.2 01/15/2021  ? HCT 42.4 01/15/2021  ? PLT 267.0  01/15/2021  ? GLUCOSE 100 (H) 01/15/2021  ? CHOL 227 (H) 01/15/2021  ? TRIG 285.0 (H) 01/15/2021  ? HDL 45.40 01/15/2021  ? LDLDIRECT 140.0 01/15/2021  ? LDLCALC 139 (H) 10/30/2017  ? ALT 8 01/15/2021  ? AST 14 01/15/2021  ? NA 137 01/15/2021  ? K 4.0 01/15/2021  ? CL 103 01/15/2021  ? CREATININE 0.72 01/15/2021  ? BUN 11 01/15/2021  ? CO2 26 01/15/2021  ? TSH 0.85 01/15/2021  ? HGBA1C 5.8 01/15/2021  ? ? ?Lab Results  ?Component Value Date  ? TSH 0.85 01/15/2021  ? ?Lab Results  ?Component Value Date  ? WBC 6.7 01/15/2021  ? HGB 14.2 01/15/2021  ? HCT 42.4 01/15/2021  ? MCV 91.1 01/15/2021  ? PLT 267.0 01/15/2021  ? ?Lab Results  ?Component Value Date  ? NA 137 01/15/2021  ? K 4.0 01/15/2021  ? CO2 26 01/15/2021  ? GLUCOSE 100 (H) 01/15/2021  ? BUN 11 01/15/2021  ? CREATININE 0.72 01/15/2021  ? BILITOT 0.5 01/15/2021  ? ALKPHOS 79 01/15/2021  ? AST 14 01/15/2021  ? ALT 8 01/15/2021  ? PROT 6.9 01/15/2021  ? ALBUMIN 4.0 01/15/2021  ? CALCIUM 9.4 01/15/2021  ? ANIONGAP 5 08/07/2016  ? GFR 90.98 01/15/2021  ? ?Lab Results  ?Component Value Date  ? CHOL 227 (H) 01/15/2021  ? ?Lab Results  ?Component Value Date  ? HDL 45.40 01/15/2021  ? ?Lab Results  ?Component Value Date  ? LDLCALC 139 (H) 10/30/2017  ? ?Lab Results  ?Component Value Date  ? TRIG 285.0 (H) 01/15/2021  ? ?Lab Results  ?Component Value Date  ? CHOLHDL 5 01/15/2021  ? ?Lab Results  ?Component Value Date  ? HGBA1C 5.8 01/15/2021  ? ? ?   ?Assessment & Plan:  ? ?Problem List Items Addressed This Visit   ?None ?Visit Diagnoses   ? ? Non-recurrent acute serous otitis media of left ear    -  Primary  ? Relevant Medications  ? doxycycline (VIBRA-TABS) 100 MG tablet  ? ofloxacin (FLOXIN OTIC) 0.3 % OTIC solution  ? Acute swimmer's ear of left side      ? Relevant Medications  ? doxycycline  (VIBRA-TABS) 100 MG tablet  ? ofloxacin (FLOXIN OTIC) 0.3 % OTIC solution  ? ?  ? ? ?Meds ordered this encounter  ?Medications  ? doxycycline (VIBRA-TABS) 100 MG tablet  ?  Sig: Take 1 tablet (100 mg total) b

## 2021-09-07 NOTE — Patient Instructions (Signed)

## 2021-09-10 ENCOUNTER — Other Ambulatory Visit (HOSPITAL_COMMUNITY)
Admission: RE | Admit: 2021-09-10 | Discharge: 2021-09-10 | Disposition: A | Discharge status: Home / Self Care | Payer: 59 | Source: Ambulatory Visit | Attending: Family Medicine | Admitting: Family Medicine

## 2021-09-10 ENCOUNTER — Encounter: Payer: Self-pay | Admitting: Family Medicine

## 2021-09-10 ENCOUNTER — Ambulatory Visit: Payer: 59 | Admitting: Family Medicine

## 2021-09-10 VITALS — BP 130/98 | HR 89 | Temp 98.4°F | Resp 20 | Ht 67.0 in | Wt 180.6 lb

## 2021-09-10 DIAGNOSIS — N760 Acute vaginitis: Secondary | ICD-10-CM

## 2021-09-10 DIAGNOSIS — H60332 Swimmer's ear, left ear: Secondary | ICD-10-CM | POA: Diagnosis not present

## 2021-09-10 DIAGNOSIS — H6502 Acute serous otitis media, left ear: Secondary | ICD-10-CM | POA: Diagnosis not present

## 2021-09-10 MED ORDER — TERCONAZOLE 0.4 % VA CREA: 1.0000 | TOPICAL_CREAM | Freq: Every day | VAGINAL | 0 refills | Status: DC

## 2021-09-10 MED ORDER — OFLOXACIN 0.3 % OT SOLN: 10.0000 [drp] | Freq: Every day | OTIC | 0 refills | Status: DC

## 2021-09-10 MED ORDER — FLUCONAZOLE 150 MG PO TABS: ORAL_TABLET | ORAL | 0 refills | Status: DC

## 2021-09-10 NOTE — Patient Instructions (Signed)

## 2021-09-10 NOTE — Progress Notes (Signed)
? ?Subjective:  ? ?By signing my name below, I, Carylon Perches, attest that this documentation has been prepared under the direction and in the presence of Roma Schanz DO, 09/10/2021  ? ? Patient ID: Annette Woods, female    DOB: [birth year], 61 y.o.   MRN: [id number] ? ?Chief Complaint  ?Patient presents with  ? Otitis Media  ? Follow-up  ? ? ?HPI ?Patient is in today for an office visit. ? ?She complains of a possible yeast infection. She noticed symptoms once she begun taking 100 MG of Doxycycline. She describes a burning sensation when urinating. She has used Estradiol cream but it did not help her symptoms. She notes that her symptoms are getting increasingly worse.  ? ?Her left ear pain is still persistent. She has been taking her medications as prescribed and says symptoms are improving.   ? ?Past Medical History:  ?Diagnosis Date  ? Abnormal CT scan, chest 01/06/2013  ? Anxiety   ? Hemorrhoids   ? Interstitial cystitis   ? Nephrolithiasis   ? SEIZURES, HX OF 12/17/2006  ? Qualifier: History of  By: Cletus Gash MD, [place]    ? ? ?Past Surgical History:  ?Procedure Laterality Date  ? endometiral ablation    ? kidney stone extraction    ? 06-2012  ? TUBAL LIGATION    ? ? ?Family History  ?Problem Relation Age of Onset  ? Lung cancer Father   ?     smoker   ? Heart attack Father 55  ? Diabetes Other   ?     GF, GM?  ? Breast cancer Neg Hx   ? Colon cancer Neg Hx   ? Stroke Neg Hx   ? ? ?Social History  ? ?Socioeconomic History  ? Marital status: Married  ?  Spouse name: Not on file  ? Number of children: 1  ? Years of education: Not on file  ? Highest education level: Not on file  ?Occupational History  ? 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 Smoking status: Never  ? Smokeless tobacco: Never  ?Substance and Sexual Activity  ? Alcohol use: No  ? Drug use: No  ? Sexual activity: Not on file  ?Other Topics Concern  ? Not on file  ?Social History Narrative  ? Moved to [place]  2008 from [facility]   ? Her son and his wife living temporarily w/ them  ? ?Social Determinants of Health  ? ?Financial Resource Strain: Not on file  ?Food Insecurity: Not on file  ?Transportation Needs: Not on file  ?Physical Activity: Not on file  ?Stress: Not on file  ?Social Connections: Not on file  ?Intimate Partner Violence: Not on file  ? ? ?Outpatient Medications Prior to Visit  ?Medication Sig Dispense Refill  ? doxycycline (VIBRA-TABS) 100 MG tablet Take 1 tablet (100 mg total) by mouth 2 (two) times daily. 20 tablet 0  ? EPINEPHrine 0.3 mg/0.3 mL IJ SOAJ injection Inject 0.3 mLs (0.3 mg total) into the skin once as needed for up to 1 dose for anaphylaxis. 2 each 3  ? escitalopram (LEXAPRO) 20 MG tablet Take 1 tablet (20 mg total) by mouth daily. 90 tablet 3  ? estradiol (ESTRACE) 0.5 MG tablet Take 0.5 mg by mouth daily.    ? progesterone (PROMETRIUM) 100 MG capsule Take 100 mg by mouth daily.    ? zolpidem (AMBIEN) 10 MG tablet Take 0.5-1 tablets (5-10 mg total) by mouth at  bedtime as needed for sleep. 30 tablet 0  ? ofloxacin (FLOXIN OTIC) 0.3 % OTIC solution Place 10 drops into the left ear daily. 10 mL 0  ? ?No facility-administered medications prior to visit.  ? ? ?Allergies  ?Allergen Reactions  ? Penicillins Anaphylaxis  ? Bee Venom Swelling  ? Doxycycline Itching and Other (See Comments)  ?  Severe vaginitis and ic flare   ? Macrobid [Nitrofurantoin] Itching  ?  See OV note 11-15-2020  ? Pyridium [Phenazopyridine] Itching  ?  See OV note 11-15-2020  ? ? ?Review of Systems  ?Constitutional:  Negative for fever and malaise/fatigue.  ?HENT:  Negative for congestion.   ?Eyes:  Negative for blurred vision.  ?Respiratory:  Negative for shortness of breath.   ?Cardiovascular:  Negative for chest pain, palpitations and leg swelling.  ?Gastrointestinal:  Negative for abdominal pain, blood in stool and nausea.  ?Genitourinary:  Positive for dysuria. Negative for frequency.  ?     VAGINAL D/C SINCE TAKING DOXY--- AS  SOON AS THE URINE HITS THE SKIN IT BURNS  ?  ?Musculoskeletal:  Negative for falls.  ?Skin:  Negative for rash.  ?Neurological:  Negative for dizziness, loss of consciousness and headaches.  ?Endo/Heme/Allergies:  Negative for environmental allergies.  ?Psychiatric/Behavioral:  Negative for depression. The patient is not nervous/anxious.   ? ?   ?Objective:  ?  ?Physical Exam ?Vitals and nursing note reviewed.  ?Constitutional:   ?   General: She is not in acute distress. ?   Appearance: Normal appearance. She is not ill-appearing.  ?HENT:  ?   Head: Normocephalic and atraumatic.  ?   Right Ear: External ear normal. No swelling or tenderness. No middle ear effusion. Tympanic membrane is not injected, erythematous, retracted or bulging.  ?   Left Ear: External ear normal.  ?   Ears:  ?   Comments: L EAR---  CANAL ERYTHEMATOUS ?TM i  ?Eyes:  ?   Extraocular Movements: Extraocular movements intact.  ?   Pupils: Pupils are equal, round, and reactive to light.  ?Cardiovascular:  ?   Rate and Rhythm: Normal rate and regular rhythm.  ?   Heart sounds: Normal heart sounds. No murmur heard. ?  No gallop.  ?Pulmonary:  ?   Effort: Pulmonary effort is normal. No respiratory distress.  ?   Breath sounds: Normal breath sounds. No wheezing or rales.  ?Genitourinary: ?   Labia:     ?   Right: Rash and tenderness present.     ?   Left: Rash and tenderness present.   ?   Vagina: Vaginal discharge and erythema present.  ?Skin: ?   General: Skin is warm and dry.  ?Neurological:  ?   Mental Status: She is alert and oriented to person, place, and time.  ?Psychiatric:     ?   Judgment: Judgment normal.  ? ? ?BP (!) 130/98 (BP Location: Left Arm, Patient Position: Sitting, Cuff Size: Normal)   Pulse 89   Temp 98.4 ?F (36.9 ?C) (Oral)   Resp 20   Ht '5\' 7"'$  (1.702 m)   Wt 180 lb 9.6 oz (81.9 kg)   SpO2 99%   BMI 28.29 kg/m?  ?Wt Readings from Last 3 Encounters:  ?09/10/21 180 lb 9.6 oz (81.9 kg)  ?09/07/21 180 lb 12.8 oz (82 kg)   ?01/15/21 177 lb 6 oz (80.5 kg)  ? ? ?Diabetic Foot Exam - Simple   ?No data filed ?  ? ?Lab Results  ?  Component Value Date  ? WBC 6.7 01/15/2021  ? HGB 14.2 01/15/2021  ? HCT 42.4 01/15/2021  ? PLT 267.0 01/15/2021  ? GLUCOSE 100 (H) 01/15/2021  ? CHOL 227 (H) 01/15/2021  ? TRIG 285.0 (H) 01/15/2021  ? HDL 45.40 01/15/2021  ? LDLDIRECT 140.0 01/15/2021  ? LDLCALC 139 (H) 10/30/2017  ? ALT 8 01/15/2021  ? AST 14 01/15/2021  ? NA 137 01/15/2021  ? K 4.0 01/15/2021  ? CL 103 01/15/2021  ? CREATININE 0.72 01/15/2021  ? BUN 11 01/15/2021  ? CO2 26 01/15/2021  ? TSH 0.85 01/15/2021  ? HGBA1C 5.8 01/15/2021  ? ? ?Lab Results  ?Component Value Date  ? TSH 0.85 01/15/2021  ? ?Lab Results  ?Component Value Date  ? WBC 6.7 01/15/2021  ? HGB 14.2 01/15/2021  ? HCT 42.4 01/15/2021  ? MCV 91.1 01/15/2021  ? PLT 267.0 01/15/2021  ? ?Lab Results  ?Component Value Date  ? NA 137 01/15/2021  ? K 4.0 01/15/2021  ? CO2 26 01/15/2021  ? GLUCOSE 100 (H) 01/15/2021  ? BUN 11 01/15/2021  ? CREATININE 0.72 01/15/2021  ? BILITOT 0.5 01/15/2021  ? ALKPHOS 79 01/15/2021  ? AST 14 01/15/2021  ? ALT 8 01/15/2021  ? PROT 6.9 01/15/2021  ? ALBUMIN 4.0 01/15/2021  ? CALCIUM 9.4 01/15/2021  ? ANIONGAP 5 08/07/2016  ? GFR 90.98 01/15/2021  ? ?Lab Results  ?Component Value Date  ? CHOL 227 (H) 01/15/2021  ? ?Lab Results  ?Component Value Date  ? HDL 45.40 01/15/2021  ? ?Lab Results  ?Component Value Date  ? LDLCALC 139 (H) 10/30/2017  ? ?Lab Results  ?Component Value Date  ? TRIG 285.0 (H) 01/15/2021  ? ?Lab Results  ?Component Value Date  ? CHOLHDL 5 01/15/2021  ? ?Lab Results  ?Component Value Date  ? HGBA1C 5.8 01/15/2021  ? ? ?   ?Assessment & Plan:  ? ?Problem List Items Addressed This Visit   ?None ?Visit Diagnoses   ? ? Acute vaginitis    -  Primary  ? Relevant Medications  ? fluconazole (DIFLUCAN) 150 MG tablet  ? terconazole (TERAZOL 7) 0.4 % vaginal cream  ? Other Relevant Orders  ? Cervicovaginal ancillary only( )  ?  Non-recurrent acute serous otitis media of left ear      ? Relevant Medications  ? fluconazole (DIFLUCAN) 150 MG tablet  ? ofloxacin (FLOXIN OTIC) 0.3 % OTIC solution  ? Acute swimmer's ear of left side      ? Relevant Medic

## 2021-09-12 LAB — CERVICOVAGINAL ANCILLARY ONLY
Bacterial Vaginitis (gardnerella): NEGATIVE
Candida Glabrata: NEGATIVE
Candida Vaginitis: NEGATIVE
Chlamydia: NEGATIVE
Comment: NEGATIVE
Comment: NEGATIVE
Comment: NEGATIVE
Comment: NEGATIVE
Comment: NEGATIVE
Comment: NORMAL
Neisseria Gonorrhea: NEGATIVE
Trichomonas: NEGATIVE

## 2021-10-29 ENCOUNTER — Ambulatory Visit: Payer: 59 | Admitting: Internal Medicine

## 2021-10-29 ENCOUNTER — Encounter: Payer: Self-pay | Admitting: Internal Medicine

## 2021-10-29 VITALS — BP 126/76 | HR 75 | Temp 98.3°F | Resp 16 | Ht 67.0 in | Wt 178.1 lb

## 2021-10-29 DIAGNOSIS — L2082 Flexural eczema: Secondary | ICD-10-CM

## 2021-10-29 MED ORDER — PREDNISOLONE ACETATE 1 % OP SUSP: OPHTHALMIC | 0 refills | Status: DC

## 2021-10-29 MED ORDER — HYDROCORTISONE 2.5 % EX CREA: TOPICAL_CREAM | Freq: Two times a day (BID) | CUTANEOUS | 1 refills | Status: AC

## 2021-10-29 NOTE — Patient Instructions (Addendum)
Just an Juluis Rainier- you are due for your repeat colonoscopy with [facility] on 11/05/2021- please contact their office to schedule that if you have not already.   For each episode of eczema, apply the cream and eardrops twice a day for 5 days

## 2021-10-29 NOTE — Progress Notes (Unsigned)
   Subjective:    Patient ID: Annette Woods, female    DOB: 1961/04/13, 61 y.o.   MRN: [id number]  DOS:  10/29/2021 Type of visit - description: acute  Was seen by Dr. Etter Sjogren 6 weeks ago, had ear pain worse on the left.  TMs were erythematous, some yellow fluid inside the L ear canal, Rx doxycycline otic Floxin.  Dx serous otitis media and left otitis externa. Subsequently developed a yeast infection so stopped doxycycline but did  use otic Floxin.  She is here because symptoms have resurfaced, left ear is painful, a couple of days ago noted drops of blood and a question of discharge. Also, for more than a year, skin around the lobule is on and off crackled and peeling  Review of Systems Denies sinus pain congestion. No allergy  Past Medical History:  Diagnosis Date   Abnormal CT scan, chest 01/06/2013   Anxiety    Hemorrhoids    Interstitial cystitis    Nephrolithiasis    SEIZURES, HX OF 12/17/2006   Qualifier: History of  By: Cletus Gash MD, Luis      Past Surgical History:  Procedure Laterality Date   endometiral ablation     kidney stone extraction     06-2012   TUBAL LIGATION      Current Outpatient Medications  Medication Instructions   doxycycline (VIBRA-TABS) 100 mg, Oral, 2 times daily   EPINEPHrine (EPI-PEN) 0.3 mg, Subcutaneous, Once PRN   escitalopram (LEXAPRO) 20 mg, Oral, Daily   estradiol (ESTRACE) 0.5 mg, Daily   fluconazole (DIFLUCAN) 150 MG tablet 1 po x1, may repeat in 3 days prn   ofloxacin (FLOXIN OTIC) 0.3 % OTIC solution 10 drops, Both EARS, Daily   progesterone (PROMETRIUM) 100 mg, Daily   terconazole (TERAZOL 7) 0.4 % vaginal cream 1 applicator, Vaginal, Daily at bedtime   zolpidem (AMBIEN) 5-10 mg, Oral, At bedtime PRN       Objective:   Physical Exam BP 126/76   Pulse 75   Temp 98.3 F (36.8 C) (Oral)   Resp 16   Ht '5\' 7"'$  (1.702 m)   Wt 178 lb 2 oz (80.8 kg)   SpO2 97%   BMI 27.90 kg/m  General:   Well developed, NAD, BMI noted. HEENT:   Normocephalic . Face symmetric, atraumatic R ear: Normal L ear: Tympanic membrane normal Canal no swelling, no discharge, no blisters.  There is a 2 mm area that apparently bled recently. Skin at the canal and concha is dry and slightly scaly. Lower extremities: no pretibial edema bilaterally  Skin: Not pale. Not jaundice Neurologic:  alert & oriented X3.  Speech normal, gait appropriate for age and unassisted Psych--  Cognition and judgment appear intact.  Cooperative with normal attention span and concentration.  Behavior appropriate. No anxious or depressed appearing.      Assessment      Assessment Anxiety, insomnia Interstitial cystitis - sees urology Bee sting allergies, epi-pen Kidney stones H/O Seizures  Abnormal CT chest, 2014 --> f/u CT 2015 stable, no further CTs Menopausal at age 61 BTL  PLAN Eczema: Suspect the external skin irritation and canal and concha irritation is eczema. Recommend hydrocortisone 2.5% cream steroid drops as needed.  See AVS. Call if severe symptoms, fever chills, purulent discharge or ear bleeding

## 2021-10-30 NOTE — Assessment & Plan Note (Signed)
Eczema: Suspect the external skin irritation and canal and concha irritation is eczema. Recommend hydrocortisone 2.5% cream steroid drops as needed.  See AVS. Call if severe symptoms, fever chills, purulent discharge or ear bleeding

## 2021-12-30 ENCOUNTER — Other Ambulatory Visit: Payer: Self-pay | Admitting: Internal Medicine

## 2022-01-22 ENCOUNTER — Encounter: Payer: Self-pay | Admitting: Internal Medicine

## 2022-01-25 ENCOUNTER — Other Ambulatory Visit: Payer: Self-pay | Admitting: Internal Medicine

## 2022-01-29 ENCOUNTER — Other Ambulatory Visit: Payer: Self-pay | Admitting: Internal Medicine

## 2022-01-30 ENCOUNTER — Encounter: Payer: Self-pay | Admitting: Family Medicine

## 2022-01-30 ENCOUNTER — Ambulatory Visit: Payer: 59 | Admitting: Family Medicine

## 2022-01-30 VITALS — BP 120/80 | HR 93 | Temp 98.5°F | Ht 68.5 in | Wt 180.5 lb

## 2022-01-30 DIAGNOSIS — T63461A Toxic effect of venom of wasps, accidental (unintentional), initial encounter: Secondary | ICD-10-CM

## 2022-01-30 DIAGNOSIS — W57XXXA Bitten or stung by nonvenomous insect and other nonvenomous arthropods, initial encounter: Secondary | ICD-10-CM

## 2022-01-30 DIAGNOSIS — S40262A Insect bite (nonvenomous) of left shoulder, initial encounter: Secondary | ICD-10-CM

## 2022-01-30 MED ORDER — TRIAMCINOLONE ACETONIDE 0.1 % EX CREA: 1.0000 | TOPICAL_CREAM | Freq: Two times a day (BID) | CUTANEOUS | 0 refills | Status: DC

## 2022-01-30 NOTE — Progress Notes (Signed)
Chief Complaint  Patient presents with   reaction to wash sting    Annette Woods is a 61 y.o. female here for a skin complaint. Was stung by a wasp.  Duration: 4 days Location: L anterior chest and L elbow Pruritic? Yes Painful? No Drainage? No New soaps/lotions/topicals/detergents? No Other associated symptoms: redness Therapies tried thus far: Benadryl, cimetidine  Past Medical History:  Diagnosis Date   Abnormal CT scan, chest 01/06/2013   Anxiety    Hemorrhoids    Interstitial cystitis    Nephrolithiasis    SEIZURES, HX OF 12/17/2006   Qualifier: History of  By: Cletus Gash MD, Luis      BP 120/80   Pulse 93   Temp 98.5 F (36.9 C) (Oral)   Ht 5' 8.5" (1.74 m)   Wt 180 lb 8 oz (81.9 kg)   SpO2 98%   BMI 27.05 kg/m  Gen: awake, alert, appearing stated age Lungs: No accessory muscle use Skin: papules w central excoriation x 2 noted on anterior L shoulder, over medial epicondyle on L, there is a salmon patch w excoriation and 3 vesicles. No drainage, erythema, TTP, fluctuance, excoriation Psych: Age appropriate judgment and insight  Insect bite of left shoulder, initial encounter - Plan: triamcinolone cream (KENALOG) 0.1 %  Wasp sting, accidental or unintentional, initial encounter - Plan: triamcinolone cream (KENALOG) 0.1 %  Improving, will cover w Kenalog for 7-10 d for symptomatic management. Try not to scratch. OK to use Benadryl prn . F/u prn. The patient voiced understanding and agreement to the plan.  [place], DO 01/30/22 11:48 AM

## 2022-01-30 NOTE — Patient Instructions (Addendum)
Try not to scratch as this can make things worse. Avoid scented products while dealing with this. You may resume when the itchiness resolves. Cold/cool compresses can help.   Ice/cold pack over area for 10-15 min twice daily.  Ok to use Benadryl as needed.   Let us know if you need anything.

## 2022-02-13 ENCOUNTER — Encounter: Payer: 59 | Admitting: Internal Medicine

## 2022-02-15 ENCOUNTER — Ambulatory Visit: Payer: 59 | Admitting: Internal Medicine

## 2022-02-15 ENCOUNTER — Encounter: Payer: Self-pay | Admitting: Internal Medicine

## 2022-02-15 VITALS — BP 122/72 | HR 89 | Temp 98.2°F | Resp 18 | Ht 67.0 in | Wt 182.0 lb

## 2022-02-15 DIAGNOSIS — H6012 Cellulitis of left external ear: Secondary | ICD-10-CM | POA: Diagnosis not present

## 2022-02-15 MED ORDER — CLINDAMYCIN HCL 300 MG PO CAPS: 300.0000 mg | ORAL_CAPSULE | Freq: Three times a day (TID) | ORAL | 0 refills | Status: DC

## 2022-02-15 MED ORDER — BETAMETHASONE DIPROPIONATE AUG 0.05 % EX CREA: TOPICAL_CREAM | Freq: Two times a day (BID) | CUTANEOUS | 0 refills | Status: DC

## 2022-02-15 NOTE — Patient Instructions (Signed)
Take the antibiotic as prescribed   Use the cream I sent to day on the L ear   See you next week  Go to the Urgent care if severe swelling

## 2022-02-15 NOTE — Progress Notes (Unsigned)
Subjective:    Patient ID: Annette Woods, female    DOB: 25-Jan-1961, 61 y.o.   MRN: [id number]  DOS:  02/15/2022 Type of visit - description: Acute  Symptoms started a week ago with pain at the left ear. Has not noticed any bloody or purulent discharge She feels there is a lump or bump at the entrance of the ear canal.  No fever chills No runny nose No URI type of symptoms.  Review of Systems See above   Past Medical History:  Diagnosis Date   Abnormal CT scan, chest 01/06/2013   Anxiety    Hemorrhoids    Interstitial cystitis    Nephrolithiasis    SEIZURES, HX OF 12/17/2006   Qualifier: History of  By: Cletus Gash MD, Luis      Past Surgical History:  Procedure Laterality Date   endometiral ablation     kidney stone extraction     06-2012   TUBAL LIGATION      Current Outpatient Medications  Medication Instructions   doxycycline (VIBRA-TABS) 100 mg, Oral, 2 times daily   EPINEPHrine (EPI-PEN) 0.3 mg, Subcutaneous, Once PRN   escitalopram (LEXAPRO) 20 mg, Oral, Daily   estradiol (ESTRACE) 0.5 mg, Daily   fluconazole (DIFLUCAN) 150 MG tablet 1 po x1, may repeat in 3 days prn   hydrocortisone 2.5 % cream Topical, 2 times daily   ofloxacin (FLOXIN OTIC) 0.3 % OTIC solution 10 drops, Both EARS, Daily   prednisoLONE acetate (PRED FORTE) 1 % ophthalmic suspension 2 drops BID in your ears as needed for eczema   progesterone (PROMETRIUM) 100 mg, Daily   terconazole (TERAZOL 7) 0.4 % vaginal cream 1 applicator, Vaginal, Daily at bedtime   triamcinolone cream (KENALOG) 0.1 % 1 Application, Topical, 2 times daily   zolpidem (AMBIEN) 5-10 mg, Oral, At bedtime PRN       Objective:   Physical Exam BP 122/72   Pulse 89   Temp 98.2 F (36.8 C) (Oral)   Resp 18   Ht '5\' 7"'$  (1.702 m)   Wt 182 lb (82.6 kg)   SpO2 98%   BMI 28.51 kg/m  General:   Well developed, NAD, BMI noted. HEENT:  Normocephalic . Face symmetric, atraumatic R ear normal Left ear: Concha is slightly  red, slightly TTP. At the entrance of the ear canal posterior side there is a small bump, 2- 3 mm.  The rest of the canal and TM are normal. Heart: RRR,  no murmur.  Lower extremities: no pretibial edema bilaterally  Skin: Not pale. Not jaundice Neurologic:  alert & oriented X3.  Speech normal, gait appropriate for age and unassisted Psych--  Cognition and judgment appear intact.  Cooperative with normal attention span and concentration.  Behavior appropriate. No anxious or depressed appearing.      Assessment     Assessment Anxiety, insomnia Interstitial cystitis - sees urology Bee sting allergies, epi-pen Kidney stones H/O Seizures  Abnormal CT chest, 2014 --> f/u CT 2015 stable, no further CTs Menopausal at age 87 BTL  PLAN Cellulitis, L concha: Evidence of cellulitis on exam, has a history of eczema in the area. Needs abx.  History of anaphylaxis with penicillin, intolerant to doxycycline. Plan: Clindamycin by mouth, topical strong steroids, needs further help if area gets very swollen.  She has an appointment with me next week.  We will reassess then.  We discussed ENT referral as she has on and off problems with the left ear, declined for now.

## 2022-02-16 NOTE — Assessment & Plan Note (Signed)
Cellulitis, L concha: Evidence of cellulitis on exam, has a history of eczema in the area. Needs abx.  History of anaphylaxis with penicillin, intolerant to doxycycline. Plan: Clindamycin by mouth, topical strong steroids, needs further help if area gets very swollen.  She has an appointment with me next week.  We will reassess then.  We discussed ENT referral as she has on and off problems with the left ear, declined for now.

## 2022-02-18 ENCOUNTER — Ambulatory Visit (INDEPENDENT_AMBULATORY_CARE_PROVIDER_SITE_OTHER): Payer: 59 | Admitting: Internal Medicine

## 2022-02-18 ENCOUNTER — Encounter: Payer: Self-pay | Admitting: Internal Medicine

## 2022-02-18 VITALS — BP 128/72 | HR 73 | Temp 98.0°F | Resp 16 | Ht 67.0 in | Wt 179.4 lb

## 2022-02-18 DIAGNOSIS — Z0000 Encounter for general adult medical examination without abnormal findings: Secondary | ICD-10-CM

## 2022-02-18 DIAGNOSIS — Z0189 Encounter for other specified special examinations: Secondary | ICD-10-CM

## 2022-02-18 DIAGNOSIS — R739 Hyperglycemia, unspecified: Secondary | ICD-10-CM

## 2022-02-18 DIAGNOSIS — Z1211 Encounter for screening for malignant neoplasm of colon: Secondary | ICD-10-CM | POA: Diagnosis not present

## 2022-02-18 NOTE — Patient Instructions (Addendum)
Continue taking the antibiotics and use the cream at the left ear.  Call if symptoms resurface.  We are referring you to Dr. Carol Ada for a colonoscopy  GO TO THE LAB : Get the blood work     Annette Woods, Annette Woods Come back for physical exam in 1 year.  Sooner if needed      Advanced care planning:  Do you have a "Living will", "[place] of attorney" ?   If you already have a living will or healthcare power of attorney, is recommended you bring the copy to be scanned in your chart. The document will be available to all the doctors you see in the system.  If you don't have one, please consider create one.  Advance care planning is a process that supports adults in  understanding and sharing their preferences regarding future medical care.   The patient's preferences are recorded in documents called Advance Directives.    Advanced directives are completed (and can be modified at any time) while the patient is in full mental capacity.   The documentation should be available at all times to the patient, the family and the healthcare providers.   This legal documents direct treatment decision making and/or appoint a surrogate to make the decision if the patient is not capable to do so.    Advance directives can be documented in many types of formats,  documents have names such as:  Lliving will  Durable power of attorney for healthcare (healthcare proxy or healthcare power of attorney)  Combined directives  Physician orders for life-sustaining treatment    More information at:  [email or web address]

## 2022-02-18 NOTE — Progress Notes (Unsigned)
Subjective:    Patient ID: Annette Woods, female    DOB: [birth year], 61 y.o.   MRN: [id number]  DOS:  02/18/2022 Type of visit - description: CPX Here for CPX. Last seen last week with infection of the left external ear, on antibiotics, feeling much better. Husband is concerned about the sleep apnea, has weakness Lexandra having episode of apnea.  Mild snoring?  Energy level is pretty normal.    Review of Systems See above   Past Medical History:  Diagnosis Date   Abnormal CT scan, chest 01/06/2013   Anxiety    Hemorrhoids    Interstitial cystitis    Nephrolithiasis    SEIZURES, HX OF 12/17/2006   Qualifier: History of  By: Cletus Gash MD, Luis      Past Surgical History:  Procedure Laterality Date   endometiral ablation     kidney stone extraction     06-2012   TUBAL LIGATION      Current Outpatient Medications  Medication Instructions   augmented betamethasone dipropionate (DIPROLENE-AF) 0.05 % cream Topical, 2 times daily   clindamycin (CLEOCIN) 300 mg, Oral, 3 times daily   EPINEPHrine (EPI-PEN) 0.3 mg, Subcutaneous, Once PRN   escitalopram (LEXAPRO) 20 mg, Oral, Daily   estradiol (ESTRACE) 0.5 mg, Daily   fluconazole (DIFLUCAN) 150 MG tablet 1 po x1, may repeat in 3 days prn   hydrocortisone 2.5 % cream Topical, 2 times daily   prednisoLONE acetate (PRED FORTE) 1 % ophthalmic suspension 2 drops BID in your ears as needed for eczema   progesterone (PROMETRIUM) 100 mg, Daily   triamcinolone cream (KENALOG) 0.1 % 1 Application, Topical, 2 times daily   zolpidem (AMBIEN) 5-10 mg, Oral, At bedtime PRN       Objective:   Physical Exam BP 128/72   Pulse 73   Temp 98 F (36.7 C) (Oral)   Resp 16   Ht '5\' 7"'$  (1.702 m)   Wt 179 lb 6 oz (81.4 kg)   SpO2 97%   BMI 28.09 kg/m  General: Well developed, NAD, BMI noted Neck: No  thyromegaly  HEENT:  Normocephalic . Face symmetric, atraumatic Left ear: Concha without swelling, no redness.  Improved compared to last  week.  Canal normal. Lungs:  CTA B Normal respiratory effort, no intercostal retractions, no accessory muscle use. Heart: RRR,  no murmur.  Abdomen:  Not distended, soft, non-tender. No rebound or rigidity.   Lower extremities: no pretibial edema bilaterally  Skin: Exposed areas without rash. Not pale. Not jaundice Neurologic:  alert & oriented X3.  Speech normal, gait appropriate for age and unassisted Strength symmetric and appropriate for age.  Psych: Cognition and judgment appear intact.  Cooperative with normal attention span and concentration.  Behavior appropriate. No anxious or depressed appearing.     Assessment    Assessment Anxiety, insomnia Interstitial cystitis - sees urology Bee sting allergies, epi-pen Kidney stones H/O Seizures  Abnormal CT chest, 2014 --> f/u CT 2015 stable, no further CTs Menopausal at age 33 BTL  PLAN Here for CPX Anxiety, insomnia: On Lexapro, RF sent, rarely takes Ambien. Interstitial cystitis: Follow-up elsewhere. Cellulitis, L concha: Improving, to let me know if symptoms resurface.  She was noted to have eczema in that area, to use creams as needed. OSA?  The patient's husband has noted apnea, Epworth scale 12, positive, refer to neuro. RTC 1 year     - Tdap 2013 - shingrex: Rec - Covid COVID, flu and RSV vaccine  recommended  -- Cscope-- 11-2011, 1 small polyp, Cscope 10/2016, next 5 years, referred to GI, likes to see Dr. Benson Norway --Female care per  Dr Runell Gess, . [facility] 04-2021 [facility].   --diet and exercise discussed. --Labs:   CMP, FLP, CBC, A1c -- DEXA normal, 01-2019: normal; on  HRT, takes multivitamins.   --POA: Discussed    Cellulitis, L concha: Evidence of cellulitis on exam, has a history of eczema in the area. Needs abx.  History of anaphylaxis with penicillin, intolerant to doxycycline. Plan: Clindamycin by mouth, topical strong steroids, needs further help if area gets very swollen.  She has an appointment with me next  week.  We will reassess then.  We discussed ENT referral as she has on and off problems with the left ear, declined for now.

## 2022-02-19 ENCOUNTER — Encounter: Payer: Self-pay | Admitting: Internal Medicine

## 2022-02-19 LAB — CBC WITH DIFFERENTIAL/PLATELET
Basophils Absolute: 0 10*3/uL (ref 0.0–0.1)
Basophils Relative: 0.4 % (ref 0.0–3.0)
Eosinophils Absolute: 0.1 10*3/uL (ref 0.0–0.7)
Eosinophils Relative: 1.3 % (ref 0.0–5.0)
HCT: 43.6 % (ref 36.0–46.0)
Hemoglobin: 14.3 g/dL (ref 12.0–15.0)
Lymphocytes Relative: 14.2 % (ref 12.0–46.0)
Lymphs Abs: 1.2 10*3/uL (ref 0.7–4.0)
MCHC: 32.9 g/dL (ref 30.0–36.0)
MCV: 92 fl (ref 78.0–100.0)
Monocytes Absolute: 0.7 10*3/uL (ref 0.1–1.0)
Monocytes Relative: 8 % (ref 3.0–12.0)
Neutro Abs: 6.6 10*3/uL (ref 1.4–7.7)
Neutrophils Relative %: 76.1 % (ref 43.0–77.0)
Platelets: 308 10*3/uL (ref 150.0–400.0)
RBC: 4.74 Mil/uL (ref 3.87–5.11)
RDW: 14 % (ref 11.5–15.5)
WBC: 8.7 10*3/uL (ref 4.0–10.5)

## 2022-02-19 LAB — COMPREHENSIVE METABOLIC PANEL
ALT: 17 U/L (ref 0–35)
AST: 17 U/L (ref 0–37)
Albumin: 4.4 g/dL (ref 3.5–5.2)
Alkaline Phosphatase: 95 U/L (ref 39–117)
BUN: 12 mg/dL (ref 6–23)
CO2: 28 mEq/L (ref 19–32)
Calcium: 10.4 mg/dL (ref 8.4–10.5)
Chloride: 102 mEq/L (ref 96–112)
Creatinine, Ser: 0.85 mg/dL (ref 0.40–1.20)
GFR: 73.98 mL/min (ref >60.00)
Glucose, Bld: 102 mg/dL — ABNORMAL HIGH (ref 70–99)
Potassium: 4.4 mEq/L (ref 3.5–5.1)
Sodium: 141 mEq/L (ref 135–145)
Total Bilirubin: 0.4 mg/dL (ref 0.2–1.2)
Total Protein: 7.4 g/dL (ref 6.0–8.3)

## 2022-02-19 LAB — LIPID PANEL
Cholesterol: 245 mg/dL — ABNORMAL HIGH (ref 0–200)
HDL: 50.2 mg/dL (ref >39.00)
NonHDL: 194.68
Total CHOL/HDL Ratio: 5
Triglycerides: 324 mg/dL — ABNORMAL HIGH (ref 0.0–149.0)
VLDL: 64.8 mg/dL — ABNORMAL HIGH (ref 0.0–40.0)

## 2022-02-19 LAB — LDL CHOLESTEROL, DIRECT: Direct LDL: 151 mg/dL

## 2022-02-19 LAB — HEMOGLOBIN A1C: Hgb A1c MFr Bld: 6 % (ref 4.6–6.5)

## 2022-02-19 NOTE — Assessment & Plan Note (Signed)
-   Tdap 2013 - shingrex: Rec - Covid COVID, flu and RSV vaccine  recommended  -- Cscope-- 11-2011, 1 small polyp, Cscope 10/2016, next 5 years, referred to GI, likes to see Dr. Benson Norway --Female care per  Dr Runell Gess, . [facility] 04-2021 [facility].   --diet and exercise discussed. --Labs:   CMP, FLP, CBC, A1c -- DEXA normal, 01-2019: normal; on  HRT, takes multivitamins.   --POA: Discussed

## 2022-02-19 NOTE — Assessment & Plan Note (Signed)
Here for CPX Anxiety, insomnia: On Lexapro, RF sent, rarely takes Ambien. Interstitial cystitis: Follow-up elsewhere. Cellulitis, L concha: Improving, to let me know if symptoms resurface.  She was noted to have eczema in that area, to use creams as needed. OSA?  The patient's husband has noted apnea, Epworth scale 12, positive, refer to neuro. RTC 1 year

## 2022-02-21 ENCOUNTER — Other Ambulatory Visit: Payer: Self-pay | Admitting: Internal Medicine

## 2022-02-21 IMAGING — CT CT RENAL STONE PROTOCOL
2 of 4 series · 17 of 46 positions shown, 19 images · non-contrast
Comparison: 08/07/2016

CLINICAL DATA: Flank pain, dysuria, history of kidney stones

EXAM:
CT ABDOMEN AND PELVIS WITHOUT CONTRAST
TECHNIQUE: Multidetector CT imaging of the abdomen and pelvis was performed
following the standard protocol without IV contrast.

[Series 2: axial st · axial · 0.98mm/px · z∈[-500,-80]mm · 14 of 92 slices shown, 16 images]
[im 4/92  soft-tissue]
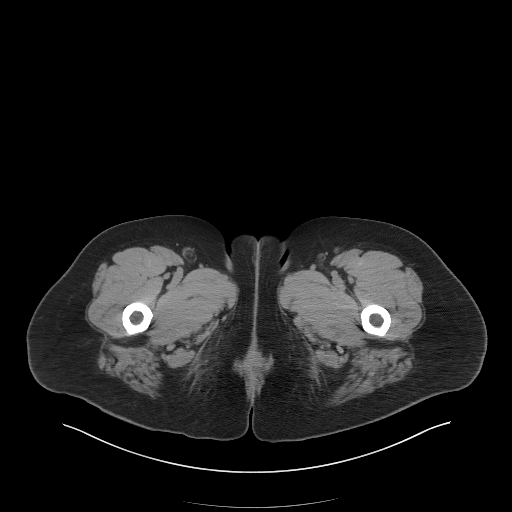
[im 4/92  bone]
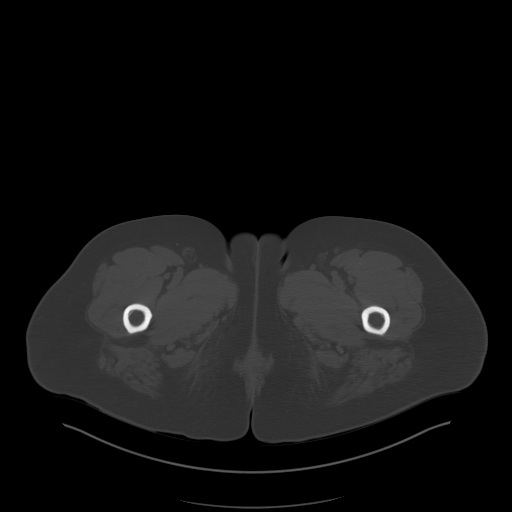
[im 11/92  soft-tissue]
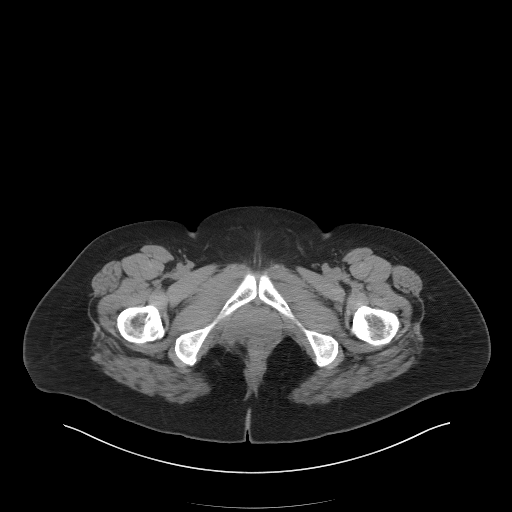
[im 19/92  soft-tissue]
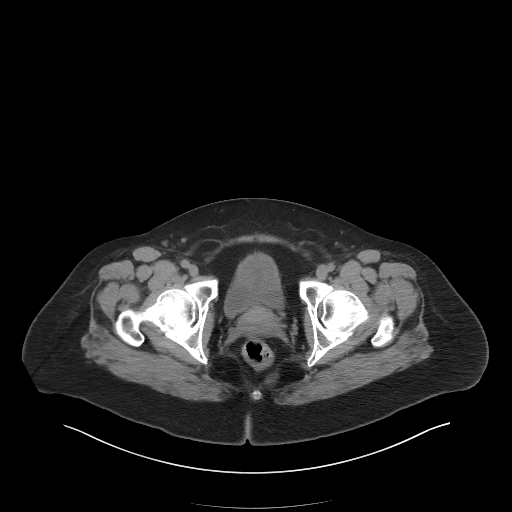
[im 26/92  soft-tissue]
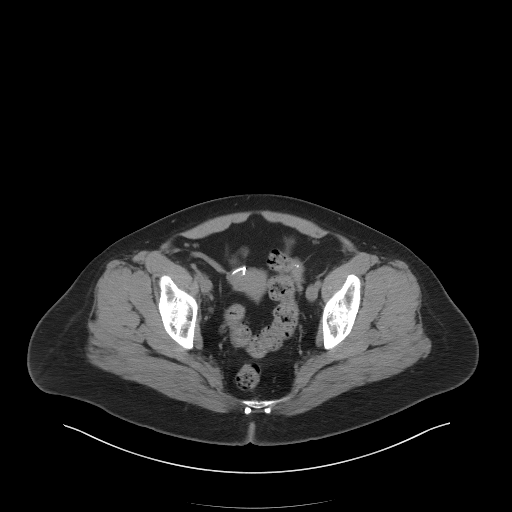
[im 30/92  soft-tissue]
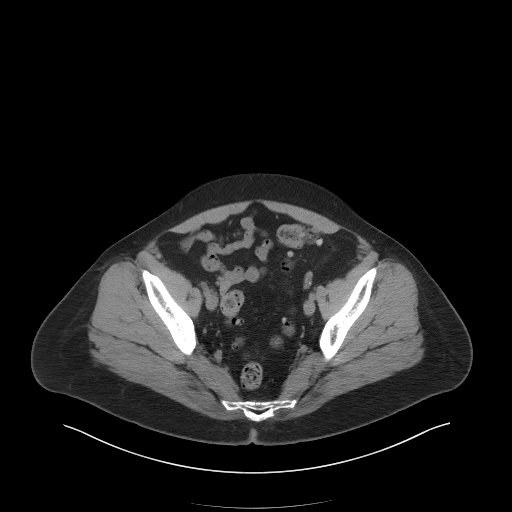
[im 37/92  soft-tissue]
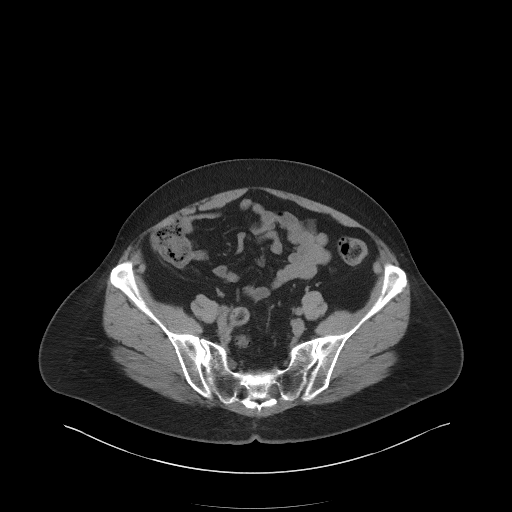
[im 44/92  soft-tissue]
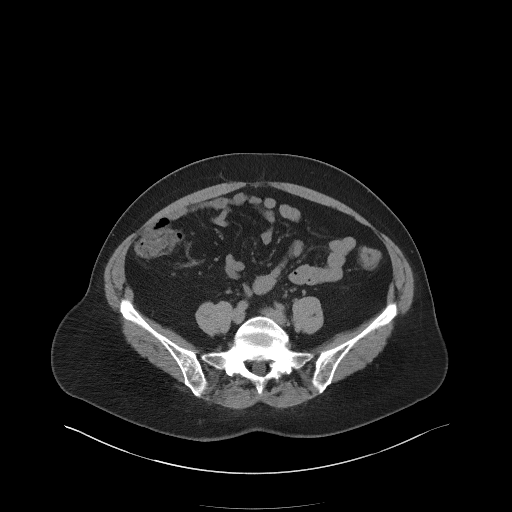
[im 48/92  soft-tissue]
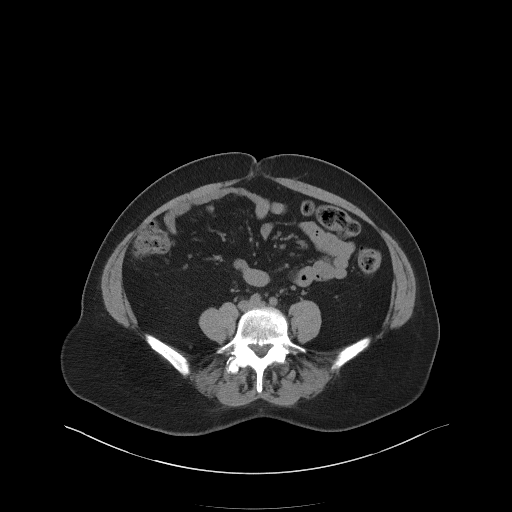
[im 55/92  soft-tissue]
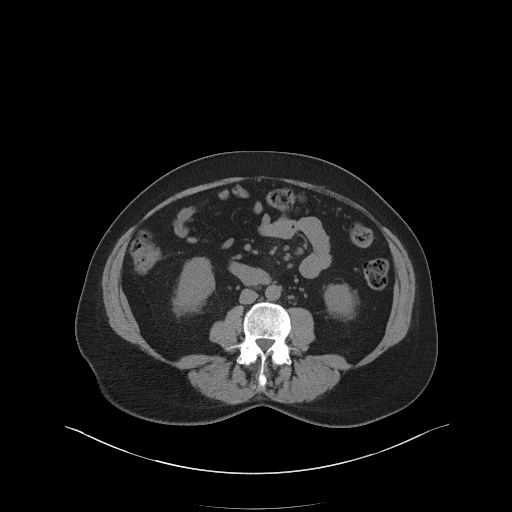
[im 55/92  bone]
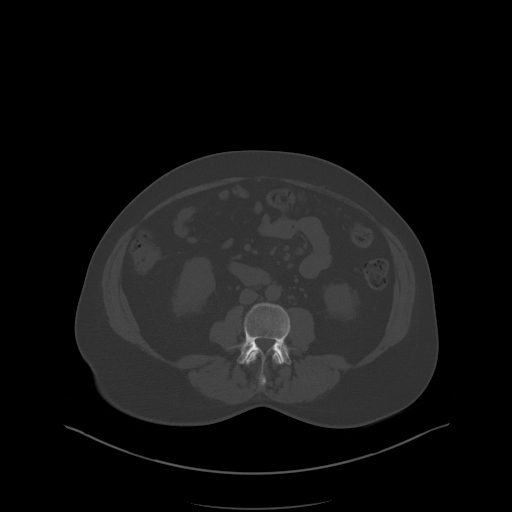
[im 62/92  soft-tissue]
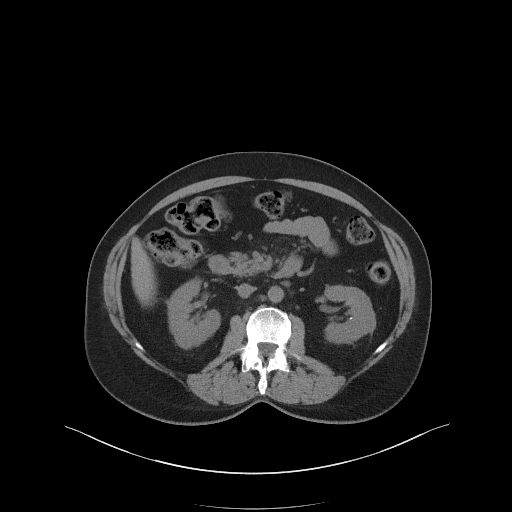
[im 70/92  soft-tissue]
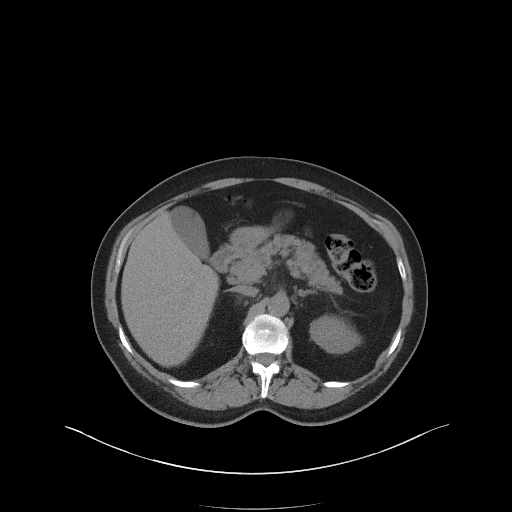
[im 73/92  soft-tissue]
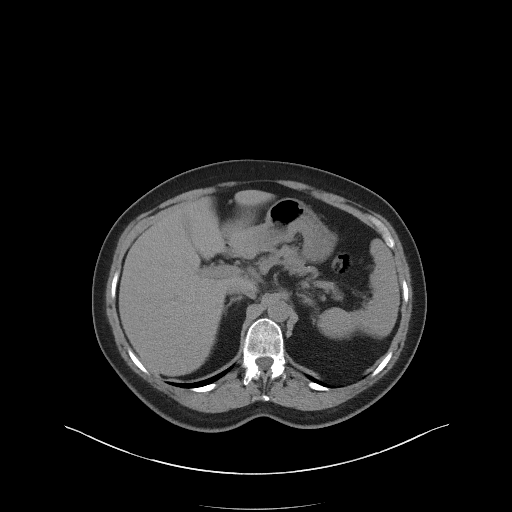
[im 81/92  soft-tissue]
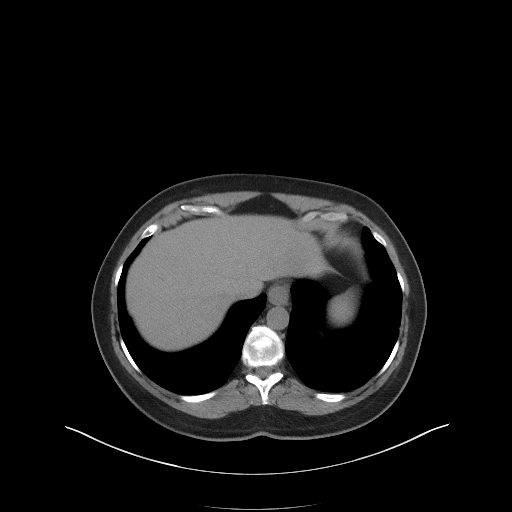
[im 88/92  soft-tissue]
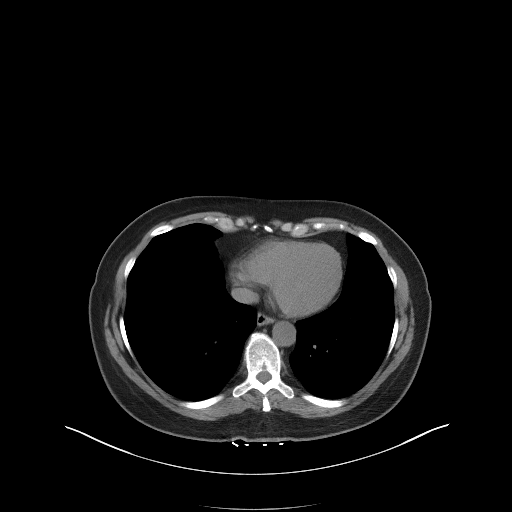

[Series 5: coronal st · coronal · 0.84mm/px · 3 of 96 slices shown]
[im 32/96  soft-tissue]
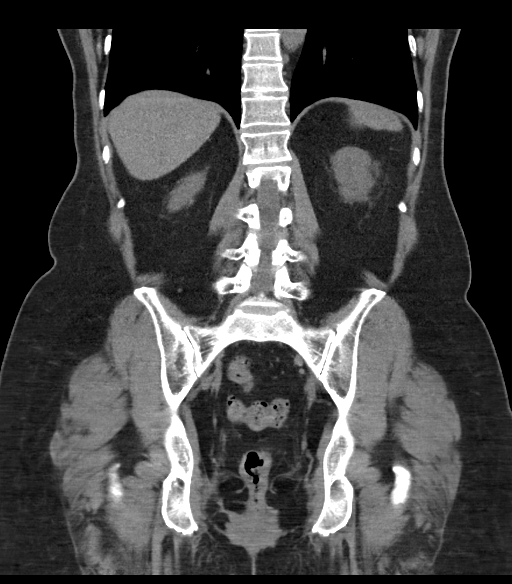
[im 43/96  soft-tissue]
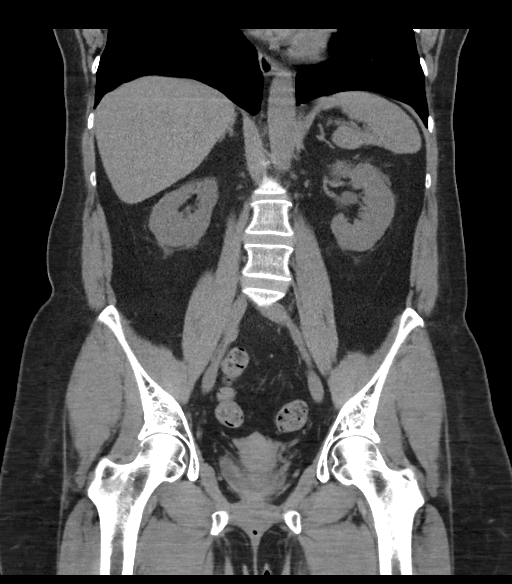
[im 53/96  soft-tissue]
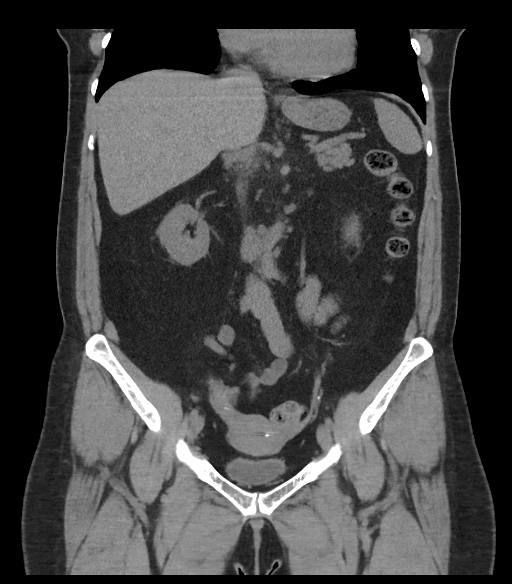

[17 of 46 positions shown; findings below may reference images not displayed]

FINDINGS: Lower chest: Lung bases are clear.

Hepatobiliary: Unenhanced liver is unremarkable.

Gallbladder is unremarkable. No intrahepatic or extrahepatic ductal
dilatation.

Pancreas: Within normal limits.

Spleen: Within normal limits.

Adrenals/Urinary Tract: Adrenal glands are within normal limits.

2 mm nonobstructing interpolar left renal calculus (series 2/image
31), unchanged. Right kidney is within normal limits.

No ureteral or bladder calculi.  No hydronephrosis.

Bladder is underdistended but unremarkable.

Stomach/Bowel: Stomach is within normal limits.

No evidence of bowel obstruction.

Normal appendix (series 2/image 60).

Mild sigmoid diverticulosis, without evidence of diverticulitis.

Vascular/Lymphatic: No evidence of abdominal aortic aneurysm.

Atherosclerotic calcifications of the abdominal aorta and branch
vessels.

No suspicious abdominopelvic lymphadenopathy.

Reproductive: Uterus is notable for bilateral Essure implants.

Bilateral ovaries are within normal limits.

Other: No abdominopelvic ascites.

Musculoskeletal: Visualized osseous structures are within normal
limits.
IMPRESSION: 2 mm nonobstructing interpolar left renal calculus, chronic. No
ureteral or bladder calculi. No hydronephrosis.

## 2022-03-11 ENCOUNTER — Encounter: Payer: Self-pay | Admitting: Internal Medicine

## 2022-03-18 ENCOUNTER — Encounter: Payer: Self-pay | Admitting: *Deleted

## 2022-03-26 ENCOUNTER — Telehealth: Payer: Self-pay | Admitting: Neurology

## 2022-03-26 NOTE — Telephone Encounter (Signed)
LVM and sent mychart msg informing pt of r/s needed for 11/07 appt- MD out.

## 2022-04-02 ENCOUNTER — Institutional Professional Consult (permissible substitution): Payer: 59 | Admitting: Neurology

## 2022-04-13 ENCOUNTER — Ambulatory Visit
Admission: EM | Admit: 2022-04-13 | Discharge: 2022-04-13 | Disposition: A | Discharge status: Home / Self Care | Payer: 59 | Attending: Emergency Medicine | Admitting: Emergency Medicine

## 2022-04-13 ENCOUNTER — Ambulatory Visit
Admit: 2022-04-13 | Discharge: 2022-04-13 | Disposition: A | Discharge status: Home / Self Care | Payer: 59 | Attending: Emergency Medicine | Admitting: Emergency Medicine

## 2022-04-13 DIAGNOSIS — K529 Noninfective gastroenteritis and colitis, unspecified: Secondary | ICD-10-CM

## 2022-04-13 DIAGNOSIS — R159 Full incontinence of feces: Secondary | ICD-10-CM

## 2022-04-13 LAB — POCT URINALYSIS DIP (MANUAL ENTRY)
Bilirubin, UA: NEGATIVE
Blood, UA: NEGATIVE
Glucose, UA: NEGATIVE mg/dL
Ketones, POC UA: NEGATIVE mg/dL
Leukocytes, UA: NEGATIVE
Nitrite, UA: NEGATIVE
Protein Ur, POC: NEGATIVE mg/dL
Spec Grav, UA: 1.025 (ref 1.010–1.025)
Urobilinogen, UA: 0.2 E.U./dL
pH, UA: 6.5 (ref 5.0–8.0)

## 2022-04-13 MED ORDER — AZITHROMYCIN 500 MG PO TABS: 500.0000 mg | ORAL_TABLET | Freq: Every day | ORAL | 0 refills | Status: DC

## 2022-04-13 NOTE — ED Provider Notes (Signed)
[facility]    CSN: [id number] Arrival date & time: 04/13/22  0855    HISTORY   Chief Complaint  Patient presents with   Diarrhea   incontinence   HPI Annette Woods is a pleasant, 61 y.o. female who presents to urgent care today. Patient states that 5 nights ago, she began to have diarrhea and bowel incontinence.  Patient states over the past 5 days she did not see any improvement as she had hoped she would.  Patient states she is also experiencing generalized body aches.  Patient states has been taking Imodium without relief of her symptoms.  Patient recently finished a prescription for clindamycin prescribed February 15, 2022.  Reports significant mucus in stool.  States she is sometimes incontinent in her sleep, sometimes unable to make it to the bathroom in time when she is awake.  Patient reports episodes of incontinence do not produce a large amount of stool, denies odor of C. difficile, adds that she cared for her mother when her mother had C. difficile and states her stool does not smell this way.  Patient states she has felt a little achy and tired but overall feels otherwise well.  Patient denies abdominal pain, fever, chills.  Patient states she has never had a similar issue in the past.  The history is provided by the patient.   Past Medical History:  Diagnosis Date   Abnormal CT scan, chest 01/06/2013   Anxiety    Hemorrhoids    Interstitial cystitis    Nephrolithiasis    SEIZURES, HX OF 12/17/2006   Qualifier: History of  By: Cletus Gash MD, [place]     Patient Active Problem List   Diagnosis Date Noted   High-tone pelvic floor dysfunction 04/10/2021   Urinary retention 04/10/2021   PCP NOTES >>>>>>>>>>>>>>>>>> 08/24/2015   Atopic dermatitis 01/06/2013   Abnormal CT scan, chest 01/06/2013   Bee sting allergy 01/06/2013   Internal hemorrhoid 05/18/2012   Annual physical exam 11/11/2011   Anxiety 09/12/2008   Menopause 09/12/2008   INSOMNIA 09/12/2008    CYSTITIS, CHRONIC INTERSTITIAL 12/17/2006   NEPHROLITHIASIS, HX OF 12/17/2006   Past Surgical History:  Procedure Laterality Date   endometiral ablation     kidney stone extraction     06-2012   TUBAL LIGATION     OB History   No obstetric history on file.    Home Medications    Prior to Admission medications   Medication Sig Start Date End Date Taking? Authorizing Provider  augmented betamethasone dipropionate (DIPROLENE-AF) 0.05 % cream Apply topically 2 (two) times daily. 02/15/22   Colon Branch, MD  clindamycin (CLEOCIN) 300 MG capsule Take 1 capsule (300 mg total) by mouth 3 (three) times daily. 02/15/22   Colon Branch, MD  EPINEPHrine 0.3 mg/0.3 mL IJ SOAJ injection Inject 0.3 mLs (0.3 mg total) into the skin once as needed for up to 1 dose for anaphylaxis. Patient not taking: Reported on 02/15/2022 12/24/19   Colon Branch, MD  escitalopram (LEXAPRO) 20 MG tablet Take 1 tablet (20 mg total) by mouth daily. 02/21/22   Colon Branch, MD  estradiol (ESTRACE) 0.5 MG tablet Take 0.5 mg by mouth daily.    [provider]  fluconazole (DIFLUCAN) 150 MG tablet 1 po x1, may repeat in 3 days prn Patient not taking: Reported on 02/15/2022 09/10/21   Ann Held, DO  hydrocortisone 2.5 % cream Apply topically 2 (two) times daily. 10/29/21 10/29/22  Colon Branch, MD  prednisoLONE acetate (PRED FORTE) 1 % ophthalmic suspension 2 drops BID in your ears as needed for eczema 10/29/21   Colon Branch, MD  progesterone (PROMETRIUM) 100 MG capsule Take 100 mg by mouth daily.    [provider]  triamcinolone cream (KENALOG) 0.1 % Apply 1 Application topically 2 (two) times daily. Patient not taking: Reported on 02/15/2022 01/30/22   Shelda Pal, DO  zolpidem (AMBIEN) 10 MG tablet Take 0.5-1 tablets (5-10 mg total) by mouth at bedtime as needed for sleep. Patient not taking: Reported on 02/15/2022 09/24/16   Colon Branch, MD    Family History Family History  Problem Relation Age of  Onset   Lung cancer Father        smoker    Heart attack Father 31   Diabetes Other        GF, GM?   Breast cancer Neg Hx    Colon cancer Neg Hx    Stroke Neg Hx    Social History Social History   Tobacco Use   Smoking status: Never   Smokeless tobacco: Never  Substance Use Topics   Alcohol use: No   Drug use: No   Allergies   Penicillins, Bee venom, Doxycycline, Macrobid [nitrofurantoin], and Pyridium [phenazopyridine]  Review of Systems Review of Systems Pertinent findings revealed after performing a 14 point review of systems has been noted in the history of present illness.  Physical Exam Triage Vital Signs ED Triage Vitals  Enc Vitals Group     BP 03/23/21 0827 (!) 147/82     Pulse Rate 03/23/21 0827 72     Resp 03/23/21 0827 18     Temp 03/23/21 0827 98.3 F (36.8 C)     Temp Source 03/23/21 0827 Oral     SpO2 03/23/21 0827 98 %     Weight --      Height --      Head Circumference --      Peak Flow --      Pain Score 03/23/21 0826 5     Pain Loc --      Pain Edu? --      Excl. in [place]? --   No data found.  Updated Vital Signs BP 112/75 (BP Location: Right Arm)   Pulse 72   Temp 98.3 F (36.8 C) (Oral)   Resp 16   SpO2 99%   Physical Exam Vitals and nursing note reviewed.  Constitutional:      General: She is not in acute distress.    Appearance: Normal appearance. She is not ill-appearing.  HENT:     Head: Normocephalic and atraumatic.  Eyes:     General: Lids are normal.        Right eye: No discharge.        Left eye: No discharge.     Extraocular Movements: Extraocular movements intact.     Conjunctiva/sclera: Conjunctivae normal.     Right eye: Right conjunctiva is not injected.     Left eye: Left conjunctiva is not injected.     Pupils: Pupils are equal, round, and reactive to light.  Neck:     Trachea: Trachea and phonation normal.  Cardiovascular:     Rate and Rhythm: Normal rate and regular rhythm.     Pulses: Normal pulses.      Heart sounds: Normal heart sounds. No murmur heard.    No friction rub. No gallop.  Pulmonary:  Effort: Pulmonary effort is normal. No accessory muscle usage, prolonged expiration or respiratory distress.     Breath sounds: Normal breath sounds. No stridor, decreased air movement or transmitted upper airway sounds. No decreased breath sounds, wheezing, rhonchi or rales.  Chest:     Chest wall: No tenderness.  Abdominal:     General: Abdomen is flat. Bowel sounds are normal. There is no distension.     Palpations: Abdomen is soft.     Tenderness: There is no abdominal tenderness. There is no right CVA tenderness or left CVA tenderness.     Hernia: No hernia is present.  Musculoskeletal:        General: Normal range of motion.     Cervical back: Normal range of motion and neck supple. Normal range of motion.  Lymphadenopathy:     Cervical: No cervical adenopathy.  Skin:    General: Skin is warm and dry.     Findings: No erythema or rash.  Neurological:     General: No focal deficit present.     Mental Status: She is alert and oriented to person, place, and time. Mental status is at baseline.  Psychiatric:        Mood and Affect: Mood normal.        Behavior: Behavior normal.        Thought Content: Thought content normal.        Judgment: Judgment normal.     Visual Acuity Right Eye Distance:   Left Eye Distance:   Bilateral Distance:    Right Eye Near:   Left Eye Near:    Bilateral Near:     UC Couse / Diagnostics / Procedures:     Radiology No results found.  Procedures Procedures (including critical care time) EKG  Pending results:  Labs Reviewed  C DIFFICILE QUICK SCREEN W PCR REFLEX    POCT URINALYSIS DIP (MANUAL ENTRY)    Medications Ordered in UC: Medications - No data to display  UC Diagnoses / Final Clinical Impressions(s)   I have reviewed the triage vital signs and the nursing notes.  Pertinent labs & imaging results that were available during  my care of the patient were reviewed by me and considered in my medical decision making (see chart for details).    Final diagnoses:  Mucoid diarrhea  Incontinence of feces, unspecified fecal incontinence type   Patient reported mucoid stool concerning for large bowel infection, will perform stool culture to evaluate for Campylobacter, Shigella, C. difficile, Yersinia, vibrio, E. coli.  Patient unable to provide a stool sample during her visit today, patient provided with a sterile container for transport back to clinic when she can.  Urinalysis did not reveal any concerns for urinary tract infection.  We discussed possibility of irritable bowel syndrome as well.  Patient provided with a prescription for azithromycin 500 mg daily for 3 days for empiric treatment of presumed bacterial diarrhea.  We will adjust treatment as needed based on results of stool testing.  ED Prescriptions     Medication Sig Dispense Auth. Provider   azithromycin (ZITHROMAX) 500 MG tablet Take 1 tablet (500 mg total) by mouth daily for 3 days. 3 tablet Lynden Oxford Scales, PA-C      PDMP not reviewed this encounter.  Pending results:  Labs Reviewed  C DIFFICILE QUICK SCREEN W PCR REFLEX    POCT URINALYSIS DIP (MANUAL ENTRY)    Discharge Instructions:   Discharge Instructions      The analysis of  your urine today was not concerning for urinary tract infection.  Please bring a sample of your stool back to the clinic today, if possible.  If not, you can certainly bring it back tomorrow.  If you are stool testing reveals any bacteria, we will provide you with antibiotic treatment.  The results of your testing will be available to your MyChart in the next 3 to 5 days.  If there is a positive finding, you will be contacted by phone.  In the meantime, I recommend that you begin azithromycin 500 mg once daily for the next 3 days, I have sent a prescription to your pharmacy.  This will treat you empirically for  common foodborne pathogens such as Campylobacter, E. coli and Shigella.  As you are aware, antibiotic therapy for C. difficile is a little more involved.  We will test you for C. difficile as well even though your symptoms make this unlikely.  Thank you for visiting urgent care today.      Disposition Upon Discharge:  Condition: stable for discharge home  Patient presented with an acute illness with associated systemic symptoms and significant discomfort requiring urgent management. In my opinion, this is a condition that a prudent lay person (someone who possesses an average knowledge of health and medicine) may potentially expect to result in complications if not addressed urgently such as respiratory distress, impairment of bodily function or dysfunction of bodily organs.   Routine symptom specific, illness specific and/or disease specific instructions were discussed with the patient and/or caregiver at length.   As such, the patient has been evaluated and assessed, work-up was performed and treatment was provided in alignment with urgent care protocols and evidence based medicine.  Patient/parent/caregiver has been advised that the patient may require follow up for further testing and treatment if the symptoms continue in spite of treatment, as clinically indicated and appropriate.  Patient/parent/caregiver has been advised to return to the [facility] or PCP if no better; to PCP or the Emergency Department if new signs and symptoms develop, or if the current signs or symptoms continue to change or worsen for further workup, evaluation and treatment as clinically indicated and appropriate  The patient will follow up with their current PCP if and as advised. If the patient does not currently have a PCP we will assist them in obtaining one.   The patient may need specialty follow up if the symptoms continue, in spite of conservative treatment and management, for further workup, evaluation,  consultation and treatment as clinically indicated and appropriate.   Patient/parent/caregiver verbalized understanding and agreement of plan as discussed.  All questions were addressed during visit.  Please see discharge instructions below for further details of plan.  This office note has been dictated using Museum/gallery curator.  Unfortunately, this method of dictation can sometimes lead to typographical or grammatical errors.  I apologize for your inconvenience in advance if this occurs.  Please do not hesitate to reach out to me if clarification is needed.      Lynden Oxford Scales, PA-C 04/13/22 1312

## 2022-04-13 NOTE — ED Triage Notes (Signed)
Pt states Monday night she began having bowel incontinence, and generalized body aches.   Home interventions: imodium

## 2022-04-13 NOTE — Discharge Instructions (Signed)
The analysis of your urine today was not concerning for urinary tract infection.  Please bring a sample of your stool back to the clinic today, if possible.  If not, you can certainly bring it back tomorrow.  If you are stool testing reveals any bacteria, we will provide you with antibiotic treatment.  The results of your testing will be available to your MyChart in the next 3 to 5 days.  If there is a positive finding, you will be contacted by phone.  In the meantime, I recommend that you begin azithromycin 500 mg once daily for the next 3 days, I have sent a prescription to your pharmacy.  This will treat you empirically for common foodborne pathogens such as Campylobacter, E. coli and Shigella.  As you are aware, antibiotic therapy for C. difficile is a little more involved.  We will test you for C. difficile as well even though your symptoms make this unlikely.  Thank you for visiting urgent care today.

## 2022-04-13 NOTE — ED Notes (Signed)
Pt added to add order from provider (was seen earlier).

## 2022-04-16 ENCOUNTER — Ambulatory Visit: Payer: 59 | Admitting: Internal Medicine

## 2022-04-16 ENCOUNTER — Encounter: Payer: Self-pay | Admitting: Internal Medicine

## 2022-04-16 VITALS — BP 122/84 | HR 84 | Temp 97.9°F | Resp 18 | Ht 67.0 in | Wt 180.0 lb

## 2022-04-16 DIAGNOSIS — K529 Noninfective gastroenteritis and colitis, unspecified: Secondary | ICD-10-CM

## 2022-04-16 DIAGNOSIS — R159 Full incontinence of feces: Secondary | ICD-10-CM

## 2022-04-16 NOTE — Assessment & Plan Note (Signed)
Diarrhea: Watery diarrhea with mucus associated with bowel incontinence in the context of taking a round of clindamycin within the last few weeks. Went to urgent care, Rx Zithromax which she finished. She is clinically better, still slightly tender upon palpation of the abdomen. Plan: Fluids, probiotics, avoid any more antibiotics. Stool test was sent by the patient few days ago, results are pending. Recommend to check MyChart >>  if for what ever reason the results are not posted in the next few days recommend to bring another stool sample. Call if not completely back to normal within the next 2 weeks.

## 2022-04-16 NOTE — Progress Notes (Signed)
Subjective:    Patient ID: Annette Woods, female    DOB: 04-Jan-1961, 61 y.o.   MRN: [id number]  DOS:  04/16/2022 Type of visit - description: Acute  The patient was rx  clindamycin for a  infection 02/16/2022  She started with GI symptoms approximately 04/08/2022: Mild abdominal discomfort described as gas. Diarrhea: Liquidy stools, frequent but in small amounts.  Associated with mucus but no bloody stools.  Did see a few drops of red blood on the toilet paper she thinks related to skin irritation and not with the bowels. Also had bowel incontinence, unable to reach the bathroom sometimes.  No back pain or urinary incontinence. No fever or chills No nausea or vomiting Appetite has been normal.  Went to the urgent care 04/13/2022, a stool study was sent, results pending. She was prescribed Zithromax x3 days. At this point she is doing better.  Last BM was today, small amount, slightly loose but not watery. No more BM incontinence or mucus noted. She was also feeling achy and fatigued, that is resolved.  Review of Systems See above   Past Medical History:  Diagnosis Date   Abnormal CT scan, chest 01/06/2013   Anxiety    Hemorrhoids    Interstitial cystitis    Nephrolithiasis    SEIZURES, HX OF 12/17/2006   Qualifier: History of  By: Cletus Gash MD, [place]      Past Surgical History:  Procedure Laterality Date   endometiral ablation     kidney stone extraction     06-2012   TUBAL LIGATION      Current Outpatient Medications  Medication Instructions   augmented betamethasone dipropionate (DIPROLENE-AF) 0.05 % cream Topical, 2 times daily   azithromycin (ZITHROMAX) 500 mg, Oral, Daily   EPINEPHrine (EPI-PEN) 0.3 mg, Subcutaneous, Once PRN   escitalopram (LEXAPRO) 20 mg, Oral, Daily   estradiol (ESTRACE) 0.5 mg, Daily   hydrocortisone 2.5 % cream Topical, 2 times daily   progesterone (PROMETRIUM) 100 mg, Daily       Objective:   Physical Exam BP 122/84   Pulse 84    Temp 97.9 F (36.6 C) (Oral)   Resp 18   Ht '5\' 7"'$  (1.702 m)   Wt 180 lb (81.6 kg)   SpO2 98%   BMI 28.19 kg/m  General:   Well developed, NAD, BMI noted.  HEENT:  Normocephalic . Face symmetric, atraumatic Abdomen:  Not distended, soft, very mild diffuse tenderness throughout, no mass or rebound Skin: Not pale. Not jaundice Lower extremities: no pretibial edema bilaterally  Neurologic:  alert & oriented X3.  Speech normal, gait appropriate for age and unassisted Psych--  Cognition and judgment appear intact.  Cooperative with normal attention span and concentration.  Behavior appropriate. No anxious or depressed appearing.     Assessment   Assessment Anxiety, insomnia Interstitial cystitis - sees urology Bee sting allergies, epi-pen Kidney stones H/O Seizures  Abnormal CT chest, 2014 --> f/u CT 2015 stable, no further CTs Menopausal at age 36 BTL  PLAN Diarrhea: Watery diarrhea with mucus associated with bowel incontinence in the context of taking a round of clindamycin within the last few weeks. Went to urgent care, Rx Zithromax which she finished. She is clinically better, still slightly tender upon palpation of the abdomen. Plan: Fluids, probiotics, avoid any more antibiotics. Stool test was sent by the patient few days ago, results are pending. Recommend to check MyChart >>  if for what ever reason the results are not posted in  the next few days recommend to bring another stool sample. Call if not completely back to normal within the next 2 weeks.

## 2022-04-16 NOTE — Patient Instructions (Signed)
Do not take any more antibiotics  Drink plenty of fluids  Start taking over-the-counter probiotic such as align  Take the container for a stool sample.  Before you provide a sample check "my chart":  if the results are in, there is no need to provide another sample.  If you are not back to normal in the next 2 weeks, let me know

## 2022-04-23 ENCOUNTER — Encounter: Payer: Self-pay | Admitting: Internal Medicine

## 2022-04-24 ENCOUNTER — Encounter: Payer: Self-pay | Admitting: Neurology

## 2022-04-24 ENCOUNTER — Ambulatory Visit: Payer: 59 | Admitting: Neurology

## 2022-04-24 VITALS — BP 124/69 | HR 69 | Ht 67.0 in | Wt 179.2 lb

## 2022-04-24 DIAGNOSIS — R0683 Snoring: Secondary | ICD-10-CM

## 2022-04-24 DIAGNOSIS — R0681 Apnea, not elsewhere classified: Secondary | ICD-10-CM | POA: Diagnosis not present

## 2022-04-24 DIAGNOSIS — G4719 Other hypersomnia: Secondary | ICD-10-CM

## 2022-04-24 DIAGNOSIS — R351 Nocturia: Secondary | ICD-10-CM

## 2022-04-24 DIAGNOSIS — Z9189 Other specified personal risk factors, not elsewhere classified: Secondary | ICD-10-CM

## 2022-04-24 DIAGNOSIS — E663 Overweight: Secondary | ICD-10-CM

## 2022-04-24 DIAGNOSIS — R635 Abnormal weight gain: Secondary | ICD-10-CM

## 2022-04-24 NOTE — Progress Notes (Signed)
Subjective:    Patient ID: Annette Woods is a 61 y.o. female.  HPI    Star Age, MD, PhD [facility] [address], [address] [place], [place] [address]  Dear Dr. Larose Kells,  I saw your patient, Annette Woods, upon your kind request in my sleep clinic today for initial consultation of her sleep disorder, in particular, concern for underlying obstructive sleep apnea.  The patient is unaccompanied today.  As you know, Annette Woods is a 61 year old female with an underlying medical history of interstitial cystitis, nephrolithiasis, remote history of seizure (by chart review), anxiety, and mildly overweight state, who reports snoring and witnessed apneas per husband's report, as well as daytime somnolence.  I reviewed your office note from 02/18/2022.  Her Epworth sleepiness score is 11 out of 24, fatigue severity score is 30 out of 63.  She lives with her husband, she is a retired Data processing manager, they have 1 grown son.  She is a non-smoker and does not drink any alcohol, she drinks caffeine in the form of soda or an energy drink, usually 1 a day.  She denies recurrent morning headaches or nocturnal headaches, she has nocturia anywhere from once or up to 5 times a night, depends on her interstitial cystitis condition.  She generally goes to bed around midnight and rise time is around 8 or 9 AM.  She has a TV in the bedroom but does not typically have it on at night.  They have 2 cats in the household, the cats do not sleep in their bedroom.  She has some trouble staying asleep, occasional trouble falling asleep, she has tried melatonin with variable success in the past, she had been on Ambien in the past but did not want to stay on it chronically.  Within the past 5 years she has gained weight slowly, in the realm of 20 pounds.  She had braces for most of her childhood and teenage years, due a childhood injury to her teeth.  She is not aware of any family  history of sleep apnea.  Snoring has become more noticeable in the past 1-1/2 years, her daytime somnolence has been affecting her in the past nearly 3 years.     Her Past Medical History Is Significant For: Past Medical History:  Diagnosis Date   Abnormal CT scan, chest 01/06/2013   Anxiety    Hemorrhoids    Interstitial cystitis    Nephrolithiasis    SEIZURES, HX OF 12/17/2006   Qualifier: History of  By: Cletus Gash MD, Luis      Her Past Surgical History Is Significant For: Past Surgical History:  Procedure Laterality Date   endometiral ablation     kidney stone extraction     06-2012   TUBAL LIGATION      Her Family History Is Significant For: Family History  Problem Relation Age of Onset   Lung cancer Father        smoker    Heart attack Father 55   Diabetes Other        GF, GM?   Breast cancer Neg Hx    Colon cancer Neg Hx    Stroke Neg Hx     Her Social History Is Significant For: Social History   Socioeconomic History   Marital status: Married    Spouse name: Not on file   Number of children: 1   Years of education: Not on file   Highest education  level: Not on file  Occupational History   Occupation: retired Pharmacist, hospital, still works part time  Tobacco Use   Smoking status: Never   Smokeless tobacco: Never  Substance and Sexual Activity   Alcohol use: No   Drug use: No   Sexual activity: Not on file  Other Topics Concern   Not on file  Social History Narrative   Moved to [place] 2008 from [facility]    One son, married, they live in [facility]      Right Handed      1 can Daily       Social Determinants of Health   Financial Resource Strain: Not on file  Food Insecurity: Not on file  Transportation Needs: Not on file  Physical Activity: Not on file  Stress: Not on file  Social Connections: Not on file    Her Allergies Are:  Allergies  Allergen Reactions   Penicillins Anaphylaxis   Bee Venom Swelling   Doxycycline Itching and Other (See Comments)     Severe vaginitis and ic flare    Macrobid [Nitrofurantoin] Itching    See OV note 11-15-2020   Pyridium [Phenazopyridine] Itching    See OV note 11-15-2020  :   Her Current Medications Are:  Outpatient Encounter Medications as of 04/24/2022  Medication Sig   augmented betamethasone dipropionate (DIPROLENE-AF) 0.05 % cream Apply topically 2 (two) times daily.   escitalopram (LEXAPRO) 20 MG tablet Take 1 tablet (20 mg total) by mouth daily.   estradiol (ESTRACE) 0.5 MG tablet Take 0.5 mg by mouth daily.   hydrocortisone 2.5 % cream Apply topically 2 (two) times daily.   progesterone (PROMETRIUM) 100 MG capsule Take 100 mg by mouth daily.   EPINEPHrine 0.3 mg/0.3 mL IJ SOAJ injection Inject 0.3 mLs (0.3 mg total) into the skin once as needed for up to 1 dose for anaphylaxis. (Patient not taking: Reported on 02/15/2022)   solifenacin (VESICARE) 5 MG tablet Take 5 mg by mouth daily. (Patient not taking: Reported on 04/24/2022)   No facility-administered encounter medications on file as of 04/24/2022.  :   Review of Systems:  Out of a complete 14 point review of systems, all are reviewed and negative with the exception of these symptoms as listed below:  Review of Systems  Neurological:        Pt is here for Sleep Consult. Pt states that her husband says she stop breathing when she is sleeping. Pt stats that she is having headaches. Pt states that she is feeling Fatigue during the day. Pt states that she does snore. Pt states that symptoms started last year.  ESS:11 FSS:30    Objective:  Neurological Exam  Physical Exam Physical Examination:   Vitals:   04/24/22 1118  BP: 124/69  Pulse: 69    General Examination: The patient is a very pleasant 61 y.o. female in no acute distress. She appears well-developed and well-nourished and well groomed.   HEENT: Normocephalic, atraumatic, pupils are equal, round and reactive to light, extraocular tracking is good without limitation to  gaze excursion or nystagmus noted. Hearing is grossly intact. Face is symmetric with normal facial animation. Speech is clear with no dysarthria noted. There is no hypophonia. There is no lip, neck/head, jaw or voice tremor. Neck is supple with full range of passive and active motion. There are no carotid bruits on auscultation. Oropharynx exam reveals: mild mouth dryness, good dental hygiene and moderate airway crowding, due to small airway entry, tonsils on  the smaller side, she has a bridge for the right top front tooth.  Neck circumference 15-1/4 inches, mild overbite.  Mallampati class III.  Tongue protrudes centrally and palate elevates symmetrically.  Chest: Clear to auscultation without wheezing, rhonchi or crackles noted.  Heart: S1+S2+0, regular and normal without murmurs, rubs or gallops noted.   Abdomen: Soft, non-tender and non-distended.  Extremities: There is no pitting edema in the distal lower extremities bilaterally.   Skin: Warm and dry without trophic changes noted.   Musculoskeletal: exam reveals no obvious joint deformities.   Neurologically:  Mental status: The patient is awake, alert and oriented in all 4 spheres. Her immediate and remote memory, attention, language skills and fund of knowledge are appropriate. There is no evidence of aphasia, agnosia, apraxia or anomia. Speech is clear with normal prosody and enunciation. Thought process is linear. Mood is normal and affect is normal.  Cranial nerves II - XII are as described above under HEENT exam.  Motor exam: Normal bulk, strength and tone is noted. There is no obvious action or resting tremor.  Fine motor skills and coordination: grossly intact.  Cerebellar testing: No dysmetria or intention tremor. There is no truncal or gait ataxia.  Sensory exam: intact to light touch in the upper and lower extremities.  Gait, station and balance: She stands easily. No veering to one side is noted. No leaning to one side is noted.  Posture is age-appropriate and stance is narrow based. Gait shows normal stride length and normal pace. No problems turning are noted.   Assessment and Plan:  In summary, KALINDA ROMANIELLO is a very pleasant 61 y.o.-year old female with an underlying medical history of interstitial cystitis, nephrolithiasis, remote history of seizure (by chart review), anxiety, and mildly overweight state, whose history and physical exam are concerning for sleep disordered breathing, supporting a current working diagnosis of unspecified sleep apnea, with the main differential diagnoses of obstructive sleep apnea (OSA) versus upper airway resistance syndrome (UARS) versus central sleep apnea (CSA), or mixed sleep apnea. A laboratory attended sleep study is typically considered "gold standard" for evaluation of sleep disordered breathing.   I had a long chat with the patient about my findings and the diagnosis of sleep apnea, particularly OSA, its prognosis and treatment options. We talked about medical/conservative treatments, surgical interventions and non-pharmacological approaches for symptom control. I explained, in particular, the risks and ramifications of untreated moderate to severe OSA, especially with respect to developing cardiovascular disease down the road, including congestive heart failure (CHF), difficult to treat hypertension, cardiac arrhythmias (particularly A-fib), neurovascular complications including TIA, stroke and dementia. Even type 2 diabetes has, in part, been linked to untreated OSA. Symptoms of untreated OSA may include (but may not be limited to) daytime sleepiness, nocturia (i.e. frequent nighttime urination), memory problems, mood irritability and suboptimally controlled or worsening mood disorder such as depression and/or anxiety, lack of energy, lack of motivation, physical discomfort, as well as recurrent headaches, especially morning or nocturnal headaches. We talked about the importance of  maintaining a healthy lifestyle and striving for healthy weight.  In addition, we talked about the importance of striving for and maintaining good sleep hygiene. I recommended a sleep study at this time. I outlined the differences between a laboratory attended sleep study which is considered more comprehensive and accurate over the option of a home sleep test (HST); the latter may lead to underestimation of sleep disordered breathing in some instances and does not help with diagnosing upper  airway resistance syndrome and is not accurate enough to diagnose primary central sleep apnea typically. I outlined possible surgical and non-surgical treatment options of OSA, including the use of a positive airway pressure (PAP) device (i.e. CPAP, AutoPAP/APAP or BiPAP in certain circumstances), a custom-made dental device (aka oral appliance, which would require a referral to a specialist dentist or orthodontist typically, and is generally speaking not considered for patients with full dentures or edentulous state), upper airway surgical options, such as traditional UPPP (which is not considered a first-line treatment) or the Inspire device (hypoglossal nerve stimulator, which would involve a referral for consultation with an ENT surgeon, after careful selection, following inclusion criteria - also not first-line treatment). I explained the PAP treatment option to the patient in detail, as this is generally considered first-line treatment.  The patient indicated that she would be willing to try PAP therapy, if the need arises. I explained the importance of being compliant with PAP treatment, not only for insurance purposes but primarily to improve patient's symptoms symptoms, and for the patient's long term health benefit, including to reduce Her cardiovascular risks longer-term.    We will pick up our discussion about the next steps and treatment options after testing.  We will keep her posted as to the test results by  phone call and/or MyChart messaging where possible.  We will plan to follow-up in sleep clinic accordingly as well.  I answered all her questions today and the patient was in agreement.   I encouraged her to call with any interim questions, concerns, problems or updates or email [place] through [place].  Generally speaking, sleep test authorizations may take up to 2 weeks, sometimes less, sometimes longer, the patient is encouraged to get in touch with [place] if they do not hear back from the sleep lab staff directly within the next 2 weeks.  Thank you very much for allowing me to participate in the care of this nice patient. If I can be of any further assistance to you please do not hesitate to call me at [phone].  Sincerely,   Star Age, MD, PhD

## 2022-04-24 NOTE — Patient Instructions (Signed)

## 2022-08-02 ENCOUNTER — Encounter: Payer: Self-pay | Admitting: Internal Medicine

## 2022-08-02 ENCOUNTER — Ambulatory Visit: Payer: Managed Care, Other (non HMO) | Admitting: Family

## 2022-08-02 ENCOUNTER — Encounter: Payer: Self-pay | Admitting: Family

## 2022-08-02 VITALS — BP 116/68 | HR 85 | Temp 98.1°F | Resp 18 | Ht 67.0 in | Wt 181.0 lb

## 2022-08-02 DIAGNOSIS — J069 Acute upper respiratory infection, unspecified: Secondary | ICD-10-CM

## 2022-08-02 DIAGNOSIS — R051 Acute cough: Secondary | ICD-10-CM | POA: Diagnosis not present

## 2022-08-02 LAB — POC COVID19 BINAXNOW: SARS Coronavirus 2 Ag: NEGATIVE

## 2022-08-02 LAB — POCT INFLUENZA A/B
Influenza A, POC: NEGATIVE
Influenza B, POC: NEGATIVE

## 2022-08-02 MED ORDER — HYDROCODONE BIT-HOMATROP MBR 5-1.5 MG/5ML PO SOLN: 5.0000 mL | Freq: Three times a day (TID) | ORAL | 0 refills | Status: DC | PRN

## 2022-08-02 MED ORDER — AZITHROMYCIN 250 MG PO TABS: ORAL_TABLET | ORAL | 0 refills | Status: DC

## 2022-08-02 NOTE — Progress Notes (Signed)
Annette Woods is a 62 y.o. female with the following history as recorded in EpicCare:  Patient Active Problem List   Diagnosis Date Noted   High-tone pelvic floor dysfunction 04/10/2021   Urinary retention 04/10/2021   PCP NOTES >>>>>>>>>>>>>>>>>> 08/24/2015   Atopic dermatitis 01/06/2013   Abnormal CT scan, chest 01/06/2013   Bee sting allergy 01/06/2013   Internal hemorrhoid 05/18/2012   Annual physical exam 11/11/2011   Anxiety 09/12/2008   Menopause 09/12/2008   INSOMNIA 09/12/2008   CYSTITIS, CHRONIC INTERSTITIAL 12/17/2006   NEPHROLITHIASIS, HX OF 12/17/2006    Current Outpatient Medications  Medication Sig Dispense Refill   augmented betamethasone dipropionate (DIPROLENE-AF) 0.05 % cream Apply topically 2 (two) times daily. 15 g 0   azithromycin (ZITHROMAX Z-PAK) 250 MG tablet Take 2 tablets (500 mg) PO today, then 1 tablet (250 mg) PO daily x4 days. 6 tablet 0   EPINEPHrine 0.3 mg/0.3 mL IJ SOAJ injection Inject 0.3 mLs (0.3 mg total) into the skin once as needed for up to 1 dose for anaphylaxis. 2 each 3   escitalopram (LEXAPRO) 20 MG tablet Take 1 tablet (20 mg total) by mouth daily. 90 tablet 3   estradiol (ESTRACE) 0.5 MG tablet Take 0.5 mg by mouth daily.     HYDROcodone bit-homatropine (HYCODAN) 5-1.5 MG/5ML syrup Take 5 mLs by mouth every 8 (eight) hours as needed for cough. 120 mL 0   hydrocortisone 2.5 % cream Apply topically 2 (two) times daily. 30 g 1   progesterone (PROMETRIUM) 100 MG capsule Take 100 mg by mouth daily.     solifenacin (VESICARE) 5 MG tablet Take 5 mg by mouth daily. (Patient not taking: Reported on 04/24/2022)     No current facility-administered medications for this visit.    Allergies: Penicillins, Bee venom, Doxycycline, Macrobid [nitrofurantoin], and Pyridium [phenazopyridine]  Past Medical History:  Diagnosis Date   Abnormal CT scan, chest 01/06/2013   Anxiety    Hemorrhoids    Interstitial cystitis    Nephrolithiasis    SEIZURES, HX  OF 12/17/2006   Qualifier: History of  By: Cletus Gash MD, Luis      Past Surgical History:  Procedure Laterality Date   endometiral ablation     kidney stone extraction     06-2012   TUBAL LIGATION      Family History  Problem Relation Age of Onset   Lung cancer Father        smoker    Heart attack Father 13   Diabetes Other        GF, GM?   Breast cancer Neg Hx    Colon cancer Neg Hx    Stroke Neg Hx     Social History   Tobacco Use   Smoking status: Never   Smokeless tobacco: Never  Substance Use Topics   Alcohol use: No    Subjective:   Sore throat/ fatigue x 5 days; admits that stress level has been very high- mother had stroke last week; no fever; has been using OTC salt water gargles for symptom relief; limited relief with Advil/ Tylenol; +productive cough;    Objective:  Vitals:   08/02/22 1440  BP: 116/68  Pulse: 85  Resp: 18  Temp: 98.1 F (36.7 C)  TempSrc: Oral  SpO2: 97%  Weight: 181 lb (82.1 kg)  Height: '5\' 7"'$  (1.702 m)    General: Well developed, well nourished, in no acute distress  Skin : Warm and dry.  Head: Normocephalic and atraumatic  Lungs: Respirations unlabored; clear to auscultation bilaterally without wheeze, rales, rhonchi  CVS exam: normal rate and regular rhythm.  Neurologic: Alert and oriented; speech intact; face symmetrical; moves all extremities well; CNII-XII intact without focal deficit   Assessment:  1. Acute cough   2. URI with cough and congestion     Plan:  Rx for Z-pak and Hycodan cough syrup; increase fluids, rest and follow up worse, no better.   No follow-ups on file.  Orders Placed This Encounter  Procedures   POC COVID-19    Order Specific Question:   Previously tested for COVID-19    Answer:   No    Order Specific Question:   Resident in a congregate (group) care setting    Answer:   No    Order Specific Question:   Employed in healthcare setting    Answer:   No    Order Specific Question:   Pregnant     Answer:   No   POCT Influenza A/B    Requested Prescriptions   Signed Prescriptions Disp Refills   azithromycin (ZITHROMAX Z-PAK) 250 MG tablet 6 tablet 0    Sig: Take 2 tablets (500 mg) PO today, then 1 tablet (250 mg) PO daily x4 days.   HYDROcodone bit-homatropine (HYCODAN) 5-1.5 MG/5ML syrup 120 mL 0    Sig: Take 5 mLs by mouth every 8 (eight) hours as needed for cough.

## 2022-08-07 ENCOUNTER — Encounter: Payer: Self-pay | Admitting: Internal Medicine

## 2022-12-04 ENCOUNTER — Encounter: Payer: Self-pay | Admitting: Internal Medicine

## 2022-12-05 ENCOUNTER — Telehealth: Payer: Managed Care, Other (non HMO) | Admitting: Nurse Practitioner

## 2022-12-05 DIAGNOSIS — U071 COVID-19: Secondary | ICD-10-CM | POA: Diagnosis not present

## 2022-12-05 MED ORDER — NYSTATIN 100000 UNIT/ML MT SUSP: 5.0000 mL | Freq: Four times a day (QID) | OROMUCOSAL | 0 refills | Status: DC | PRN

## 2022-12-05 MED ORDER — PROMETHAZINE-DM 6.25-15 MG/5ML PO SYRP: 5.0000 mL | ORAL_SOLUTION | Freq: Four times a day (QID) | ORAL | 0 refills | Status: DC | PRN

## 2022-12-05 MED ORDER — NIRMATRELVIR/RITONAVIR (PAXLOVID)TABLET: 3.0000 | ORAL_TABLET | Freq: Two times a day (BID) | ORAL | 0 refills | Status: AC

## 2022-12-05 NOTE — Progress Notes (Signed)
[facility] [address] [place] [address] Dept: [phone] Dept Fax: [phone]  Virtual Video Visit  I connected with [name] on 12/06/22 at  4:20 PM EDT by a video enabled telemedicine application and verified that I am speaking with the correct person using two identifiers.  Location patient: Home Location provider: Clinic Persons participating in the virtual visit: Patient; Annette Pickle, NP; Malena Peer, CMA  I discussed the limitations of evaluation and management by telemedicine and the availability of in person appointments. The patient expressed understanding and agreed to proceed.  Chief Complaint  Patient presents with   Covid Positive    Testing Positive on 12/03/22, sore throat and coughing a lot, ears stopped up    SUBJECTIVE:  HPI: Annette Woods is a 62 y.o. female who presents sore throat and cough for 4 days. Positive home covid-19 test.  UPPER RESPIRATORY TRACT INFECTION  Fever: yes - subjective Cough: yes Shortness of breath: no Wheezing: no Chest pain: no Chest tightness: no Chest congestion: no Nasal congestion: yes Runny nose: no Post nasal drip: no Sneezing: no Sore throat: yes Swollen glands: yes Sinus pressure: no Headache: yes Face pain: yes Toothache: no Ear pain: no bilateral Ear pressure: yes bilateral Eyes red/itching:no Eye drainage/crusting: no  Vomiting: no Rash: no Fatigue: yes Sick contacts: yes Strep contacts: no  Context: worse Recurrent sinusitis: no Relief with OTC cold/cough medications: no  Treatments attempted: advil, salt water gargle, increased fluids   Patient Active Problem List   Diagnosis Date Noted   COVID-19 12/06/2022   High-tone pelvic floor dysfunction 04/10/2021   Urinary retention 04/10/2021   PCP NOTES >>>>>>>>>>>>>>>>>> 08/24/2015   Atopic dermatitis 01/06/2013   Abnormal CT scan, chest 01/06/2013   Bee sting  allergy 01/06/2013   Internal hemorrhoid 05/18/2012   Annual physical exam 11/11/2011   Anxiety 09/12/2008   Menopause 09/12/2008   INSOMNIA 09/12/2008   CYSTITIS, CHRONIC INTERSTITIAL 12/17/2006   NEPHROLITHIASIS, HX OF 12/17/2006    Past Surgical History:  Procedure Laterality Date   endometiral ablation     kidney stone extraction     06-2012   TUBAL LIGATION      Family History  Problem Relation Age of Onset   Lung cancer Father        smoker    Heart attack Father 1   Diabetes Other        GF, GM?   Breast cancer Neg Hx    Colon cancer Neg Hx    Stroke Neg Hx     Social History   Tobacco Use   Smoking status: Never   Smokeless tobacco: Never  Substance Use Topics   Alcohol use: No   Drug use: No     Current Outpatient Medications:    EPINEPHrine 0.3 mg/0.3 mL IJ SOAJ injection, Inject 0.3 mLs (0.3 mg total) into the skin once as needed for up to 1 dose for anaphylaxis., Disp: 2 each, Rfl: 3   escitalopram (LEXAPRO) 20 MG tablet, Take 1 tablet (20 mg total) by mouth daily., Disp: 90 tablet, Rfl: 3   estradiol (ESTRACE) 0.5 MG tablet, Take 0.5 mg by mouth daily., Disp: , Rfl:    magic mouthwash (nystatin, lidocaine, diphenhydrAMINE, alum & mag hydroxide) suspension, Swish and swallow 5 mLs 4 (four) times daily as needed for mouth pain., Disp: 180 mL, Rfl: 0   nirmatrelvir/ritonavir (PAXLOVID) 20 x 150 MG & 10 x 100MG  TABS, Take 3  tablets by mouth 2 (two) times daily for 5 days. (Take nirmatrelvir 150 mg two tablets twice daily for 5 days and ritonavir 100 mg one tablet twice daily for 5 days) Patient GFR is 73, Disp: 30 tablet, Rfl: 0   progesterone (PROMETRIUM) 100 MG capsule, Take 100 mg by mouth daily., Disp: , Rfl:    promethazine-dextromethorphan (PROMETHAZINE-DM) 6.25-15 MG/5ML syrup, Take 5 mLs by mouth 4 (four) times daily as needed., Disp: 118 mL, Rfl: 0  Allergies  Allergen Reactions   Penicillins Anaphylaxis   Bee Venom Swelling   Doxycycline  Itching and Other (See Comments)    Severe vaginitis and ic flare    Macrobid [Nitrofurantoin] Itching    See OV note 11-15-2020   Pyridium [Phenazopyridine] Itching    See OV note 11-15-2020    ROS: See pertinent positives and negatives per HPI.  OBSERVATIONS/OBJECTIVE:  VITALS per patient if applicable: There were no vitals filed for this visit. There is no height or weight on file to calculate BMI.    GENERAL: Alert and oriented. Appears well and in no acute distress.  HEENT: Atraumatic. Conjunctiva clear. No obvious abnormalities on inspection of external nose and ears.  NECK: Normal movements of the head and neck.  LUNGS: On inspection, no signs of respiratory distress. Breathing rate appears normal. No obvious gross SOB, gasping or wheezing, and no conversational dyspnea.  CV: No obvious cyanosis.  MS: Moves all visible extremities without noticeable abnormality.  PSYCH/NEURO: Pleasant and cooperative. No obvious depression or anxiety. Speech and thought processing grossly intact.  ASSESSMENT AND PLAN:  Problem List Items Addressed This Visit       Other   COVID-19 - Primary    Will treat with paxlovid twice a day for 5 days. Encourage fluids, rest. Will also send in promethazine DM every 4 hours as needed for cough and magic mouth wash 4 times a day as needed for sore throat. She can continue to take ibuprofen and tylenol as needed.   Reviewed home care instructions for COVID. Advised self-isolation at home for at least 5 days. After 5 days, if improved and fever resolved, can be in public, but should wear a mask around others for an additional 5 days. If symptoms, esp, dyspnea develops/worsens, recommend in-person evaluation at either an urgent care or the emergency room.       Relevant Medications   nirmatrelvir/ritonavir (PAXLOVID) 20 x 150 MG & 10 x 100MG  TABS   magic mouthwash (nystatin, lidocaine, diphenhydrAMINE, alum & mag hydroxide) suspension     I  discussed the assessment and treatment plan with the patient. The patient was provided an opportunity to ask questions and all were answered. The patient agreed with the plan and demonstrated an understanding of the instructions.   The patient was advised to call back or seek an in-person evaluation if the symptoms worsen or if the condition fails to improve as anticipated.   Gerre Scull, NP

## 2022-12-06 ENCOUNTER — Telehealth: Payer: Self-pay | Admitting: Nurse Practitioner

## 2022-12-06 ENCOUNTER — Encounter: Payer: Self-pay | Admitting: Nurse Practitioner

## 2022-12-06 DIAGNOSIS — U071 COVID-19: Secondary | ICD-10-CM | POA: Insufficient documentation

## 2022-12-06 MED ORDER — LIDOCAINE VISCOUS HCL 2 % MT SOLN: 5.0000 mL | Freq: Four times a day (QID) | OROMUCOSAL | 0 refills | Status: DC | PRN

## 2022-12-06 NOTE — Assessment & Plan Note (Signed)
Will treat with paxlovid twice a day for 5 days. Encourage fluids, rest. Will also send in promethazine DM every 4 hours as needed for cough and magic mouth wash 4 times a day as needed for sore throat. She can continue to take ibuprofen and tylenol as needed.   Reviewed home care instructions for COVID. Advised self-isolation at home for at least 5 days. After 5 days, if improved and fever resolved, can be in public, but should wear a mask around others for an additional 5 days. If symptoms, esp, dyspnea develops/worsens, recommend in-person evaluation at either an urgent care or the emergency room.

## 2022-12-06 NOTE — Patient Instructions (Signed)
It was great to see you!  Start paxlovid twice a day for 5 days.   Start promethazine DM every 4 hours as needed for cough, this may make you sleepy.  Start magic mouth wash 4 times a day as needed for sore throat. Continue taking ibuprofen or tylenol as needed for pain/fever.   Drink plenty of fluids and get rest.   Isolate through tomorrow, then wear a mask if you need to go out for an addition 5 days.   Let's follow-up if your symptoms worsen or don't improve.   Take care,  Rodman Pickle, NP

## 2022-12-06 NOTE — Telephone Encounter (Signed)
Pharmacy called stating they received the order sent in for the magic mouthwash, but they do not have the Nystatin ingredient. Can this be re-written?

## 2022-12-24 LAB — HM MAMMOGRAPHY

## 2022-12-25 ENCOUNTER — Telehealth: Payer: Self-pay | Admitting: Internal Medicine

## 2022-12-25 MED ORDER — ESCITALOPRAM OXALATE 20 MG PO TABS: 20.0000 mg | ORAL_TABLET | Freq: Every day | ORAL | 0 refills | Status: DC

## 2022-12-25 NOTE — Telephone Encounter (Signed)
Pt called stating that pharmacy had told her that we had told them to discontinue her lexapro and she was ineligible for her last refill of it off her 9.28.23 script. Please Advise.

## 2022-12-25 NOTE — Telephone Encounter (Signed)
Rx sent 

## 2022-12-25 NOTE — Addendum Note (Signed)
Addended byConrad Carencro D on: 12/25/2022 12:26 PM   Modules accepted: Orders

## 2023-02-10 ENCOUNTER — Ambulatory Visit: Payer: Managed Care, Other (non HMO) | Admitting: Internal Medicine

## 2023-02-10 ENCOUNTER — Ambulatory Visit (HOSPITAL_BASED_OUTPATIENT_CLINIC_OR_DEPARTMENT_OTHER)
Admission: RE | Admit: 2023-02-10 | Discharge: 2023-02-10 | Disposition: A | Discharge status: Home / Self Care | Payer: Managed Care, Other (non HMO) | Source: Ambulatory Visit | Attending: Internal Medicine | Admitting: Internal Medicine

## 2023-02-10 VITALS — BP 116/70 | HR 76 | Ht 67.0 in

## 2023-02-10 DIAGNOSIS — M542 Cervicalgia: Secondary | ICD-10-CM

## 2023-02-10 DIAGNOSIS — M5412 Radiculopathy, cervical region: Secondary | ICD-10-CM | POA: Diagnosis not present

## 2023-02-10 MED ORDER — PREDNISONE 10 MG PO TABS: ORAL_TABLET | ORAL | 0 refills | Status: DC

## 2023-02-10 NOTE — Progress Notes (Signed)
Subjective:    Patient ID: Annette Woods, female    DOB: June 02, 1960, 62 y.o.   MRN: [id number]  DOS:  02/10/2023 Type of visit - description: Acute  Symptoms started a week ago approximately. Pain located at the base of the right neck with radiation to the arm to the thumb. The pain is constant, denies any fever chills or headaches. No rash. Little help with ice, heat, massage or ibuprofen.  Denies any upper or lower extremities paresthesias or weaknesses. No bladder or bowel incontinence.  Also in the last 2 to 3 days she has been getting a little dizzy when she stands.  Review of Systems See above   Past Medical History:  Diagnosis Date   Abnormal CT scan, chest 01/06/2013   Anxiety    Hemorrhoids    Interstitial cystitis    Nephrolithiasis    SEIZURES, HX OF 12/17/2006   Qualifier: History of  By: Blossom Hoops MD, Luis      Past Surgical History:  Procedure Laterality Date   endometiral ablation     kidney stone extraction     06-2012   TUBAL LIGATION      Current Outpatient Medications  Medication Instructions   EPINEPHrine (EPI-PEN) 0.3 mg, Subcutaneous, Once PRN   escitalopram (LEXAPRO) 20 mg, Oral, Daily   estradiol (ESTRACE) 0.5 mg, Daily   magic mouthwash (lidocaine, diphenhydrAMINE, alum & mag hydroxide) suspension 5 mLs, Swish & Swallow, 4 times daily PRN   progesterone (PROMETRIUM) 100 mg, Daily   promethazine-dextromethorphan (PROMETHAZINE-DM) 6.25-15 MG/5ML syrup 5 mLs, Oral, 4 times daily PRN       Objective:   Physical Exam BP 116/70 (BP Location: Left Arm, Patient Position: Sitting, Cuff Size: Normal)   Pulse 76   Ht 5\' 7"  (1.702 m)   SpO2 98%   BMI 28.35 kg/m  General:   Well developed, NAD, BMI noted. HEENT:  Normocephalic . Face symmetric, atraumatic Neck: No TTP of the cervical spine.  Range of motion mildly limited particularly the hyperextension-- due to pain. Lower extremities: no pretibial edema bilaterally  Skin: Not pale. Not  jaundice Neurologic:  alert & oriented X3.  Speech normal, gait appropriate for age and unassisted Motor & DTR symmetric  Psych--  Cognition and judgment appear intact.  Cooperative with normal attention span and concentration.  Behavior appropriate. No anxious or depressed appearing.      Assessment      Assessment Anxiety, insomnia Interstitial cystitis - sees urology Bee sting allergies, epi-pen Kidney stones H/O Seizures  Abnormal CT chest, 2014 --> f/u CT 2015 stable, no further CTs Menopausal at age 3 BTL  PLAN Neck pain with radiculopathy 1 week history of neck pain with radiation to the right arm on C6 distribution. Motor and DTR symmetric. No systemic symptoms. Plan: X-ray, prednisone, Tylenol, stop ibuprofen (did not help), refer to Ortho, did get some massage therapy after the neck pain develop , no more messages.  Call if symptoms are not gradually improving. Schedule CPX at her convenience

## 2023-02-10 NOTE — Assessment & Plan Note (Signed)
Neck pain with radiculopathy 1 week history of neck pain with radiation to the right arm on C6 distribution. Motor and DTR symmetric. No systemic symptoms. Plan: X-ray, prednisone, Tylenol, stop ibuprofen (did not help), refer to Ortho, did get some massage therapy after the neck pain develop , no more messages.  Call if symptoms are not gradually improving. Schedule CPX at her convenience

## 2023-02-10 NOTE — Patient Instructions (Addendum)
Go to the first floor and get your x-ray.  Start prednisone as prescribed  Tylenol  500 mg OTC 2 tabs a day every 8 hours as needed for pain  We are referring you to the orthopedic doctor.  They should be calling you in few days.  No more massages.  Please keep me posted  Please reschedule your visit with me at your convenience

## 2023-02-13 ENCOUNTER — Encounter: Payer: Self-pay | Admitting: Internal Medicine

## 2023-02-14 MED ORDER — HYDROCODONE-ACETAMINOPHEN 5-325 MG PO TABS: 1.0000 | ORAL_TABLET | Freq: Two times a day (BID) | ORAL | 0 refills | Status: DC | PRN

## 2023-02-19 ENCOUNTER — Telehealth: Payer: Self-pay | Admitting: Internal Medicine

## 2023-02-19 NOTE — Telephone Encounter (Signed)
Pt called with a few questions regarding running copies of imaging. Advised her that Radiology would have to run those but noted that the imaging she was referencing has had no results back in the 9 days it has been since they were taken. Advised a message would be sent back to investigate into this matter as the turnaround time doesn't make sense.

## 2023-02-20 ENCOUNTER — Encounter: Payer: 59 | Admitting: Internal Medicine

## 2023-02-20 NOTE — Telephone Encounter (Signed)
Radiology behind d/t staffing issues, waiting for radiology read back.

## 2023-02-20 NOTE — Telephone Encounter (Signed)
Pt states she is in a lot of pain and needs to see the ortho dr tomorrow and is needing her recent imaging results. She would like to know if the imaging results can at least be printed even if they aren't reviewed by today. Please advise.

## 2023-02-20 NOTE — Telephone Encounter (Signed)
Called radiology reading room- rad tech will have x-ray moved up to Stat read.

## 2023-02-20 NOTE — Telephone Encounter (Signed)
Spoke w/ Pt- informed that x-ray reading has came back- she requested I fax report to Dr. Marshell Levan office, and I gave her number to imaging downstairs to get images placed on CD to take w/ her tomorrow.

## 2023-02-25 ENCOUNTER — Other Ambulatory Visit: Payer: Self-pay | Admitting: Orthopedic Surgery

## 2023-02-25 DIAGNOSIS — M542 Cervicalgia: Secondary | ICD-10-CM

## 2023-03-04 NOTE — Discharge Instructions (Signed)

## 2023-03-05 ENCOUNTER — Ambulatory Visit
Admission: RE | Admit: 2023-03-05 | Discharge: 2023-03-05 | Disposition: A | Discharge status: Home / Self Care | Payer: Managed Care, Other (non HMO) | Source: Ambulatory Visit | Attending: Orthopedic Surgery | Admitting: Orthopedic Surgery

## 2023-03-05 DIAGNOSIS — M542 Cervicalgia: Secondary | ICD-10-CM

## 2023-03-05 MED ORDER — TRIAMCINOLONE ACETONIDE 40 MG/ML IJ SUSP (RADIOLOGY)
60.0000 mg | Freq: Once | INTRAMUSCULAR | Status: AC
  Administered 2023-03-05: 60 mg via EPIDURAL

## 2023-03-05 MED ORDER — IOPAMIDOL (ISOVUE-M 300) INJECTION 61%
1.0000 mL | Freq: Once | INTRAMUSCULAR | Status: AC | PRN
  Administered 2023-03-05: 1 mL via EPIDURAL

## 2023-03-24 ENCOUNTER — Other Ambulatory Visit: Payer: Self-pay | Admitting: Internal Medicine

## 2023-04-01 DIAGNOSIS — M5412 Radiculopathy, cervical region: Secondary | ICD-10-CM | POA: Diagnosis not present

## 2023-04-03 DIAGNOSIS — M5412 Radiculopathy, cervical region: Secondary | ICD-10-CM | POA: Diagnosis not present

## 2023-04-29 ENCOUNTER — Encounter: Payer: Self-pay | Admitting: Internal Medicine

## 2023-04-30 ENCOUNTER — Encounter: Payer: Self-pay | Admitting: Internal Medicine

## 2023-04-30 ENCOUNTER — Ambulatory Visit: Payer: BLUE CROSS/BLUE SHIELD | Admitting: Internal Medicine

## 2023-04-30 VITALS — BP 128/74 | HR 73 | Temp 98.1°F | Resp 16 | Ht 67.0 in | Wt 181.0 lb

## 2023-04-30 DIAGNOSIS — Z0000 Encounter for general adult medical examination without abnormal findings: Secondary | ICD-10-CM

## 2023-04-30 DIAGNOSIS — R739 Hyperglycemia, unspecified: Secondary | ICD-10-CM

## 2023-04-30 MED ORDER — EPINEPHRINE 0.3 MG/0.3ML IJ SOAJ: 0.3000 mg | Freq: Once | INTRAMUSCULAR | 3 refills | Status: AC | PRN

## 2023-04-30 MED ORDER — ESCITALOPRAM OXALATE 20 MG PO TABS: 20.0000 mg | ORAL_TABLET | Freq: Every day | ORAL | 2 refills | Status: DC

## 2023-04-30 NOTE — Progress Notes (Signed)
Subjective:    Patient ID: Annette Woods, female    DOB: Dec 16, 1960, 62 y.o.   MRN: [id number]  DOS:  04/30/2023 Type of visit - description: cpx  Here for CPX Doing well. Had a radiculopathy, resolved.  Review of Systems  Other than above, a 14 point review of systems is negative    Past Medical History:  Diagnosis Date   Abnormal CT scan, chest 01/06/2013   Anxiety    Hemorrhoids    Interstitial cystitis    Nephrolithiasis    SEIZURES, HX OF 12/17/2006   Qualifier: History of  By: Blossom Hoops MD, Luis      Past Surgical History:  Procedure Laterality Date   endometiral ablation     kidney stone extraction     06-2012   TUBAL LIGATION     Social History   Socioeconomic History   Marital status: Married    Spouse name: Not on file   Number of children: 1   Years of education: Not on file   Highest education level: Master's degree (e.g., MA, MS, MEng, MEd, MSW, MBA)  Occupational History   Occupation: retired Runner, broadcasting/film/video  Tobacco Use   Smoking status: Never   Smokeless tobacco: Never  Substance and Sexual Activity   Alcohol use: No   Drug use: No   Sexual activity: Not on file  Other Topics Concern   Not on file  Social History Narrative   Moved to [place] 2008 from [facility]    One son, married       Right Handed      1 can Daily       Social Determinants of Health   Financial Resource Strain: Low Risk  (02/10/2023)   Overall Financial Resource Strain (CARDIA)    Difficulty of Paying Living Expenses: Not very hard  Food Insecurity: No Food Insecurity (02/10/2023)   Hunger Vital Sign    Worried About Running Out of Food in the Last Year: Never true    Ran Out of Food in the Last Year: Never true  Transportation Needs: No Transportation Needs (02/10/2023)   PRAPARE - Administrator, Civil Service (Medical): No    Lack of Transportation (Non-Medical): No  Physical Activity: Insufficiently Active (02/10/2023)   Exercise Vital Sign    Days of Exercise per  Week: 1 day    Minutes of Exercise per Session: 50 min  Stress: No Stress Concern Present (02/10/2023)   [employer] of Occupational Health - Occupational Stress Questionnaire    Feeling of Stress : Only a little  Social Connections: Socially Integrated (02/10/2023)   Social Connection and Isolation Panel [NHANES]    Frequency of Communication with Friends and Family: More than three times a week    Frequency of Social Gatherings with Friends and Family: Three times a week    Attends Religious Services: More than 4 times per year    Active Member of Clubs or Organizations: Yes    Attends Engineer, structural: More than 4 times per year    Marital Status: Married  Catering manager Violence: Not on file    Current Outpatient Medications  Medication Instructions   EPINEPHrine (EPI-PEN) 0.3 mg, Subcutaneous, Once PRN   escitalopram (LEXAPRO) 20 mg, Oral, Daily   estradiol (ESTRACE) 0.5 mg, Daily   progesterone (PROMETRIUM) 100 mg, Daily       Objective:   Physical Exam BP 128/74 (BP Location: Left Arm, Patient Position: Sitting, Cuff Size: Normal)  Pulse 73   Temp 98.1 F (36.7 C) (Oral)   Resp 16   Ht 5\' 7"  (1.702 m)   Wt 181 lb (82.1 kg)   SpO2 97%   BMI 28.35 kg/m  General: Well developed, NAD, BMI noted Neck: No  thyromegaly  HEENT:  Normocephalic . Face symmetric, atraumatic Lungs:  CTA B Normal respiratory effort, no intercostal retractions, no accessory muscle use. Heart: RRR,  no murmur.  Abdomen:  Not distended, soft, non-tender. No rebound or rigidity.   Lower extremities: no pretibial edema bilaterally  Skin: Exposed areas without rash. Not pale. Not jaundice Neurologic:  alert & oriented X3.  Speech normal, gait appropriate for age and unassisted Strength symmetric and appropriate for age.  Psych: Cognition and judgment appear intact.  Cooperative with normal attention span and concentration.  Behavior appropriate. No anxious or  depressed appearing.     Assessment     Assessment Anxiety, insomnia Interstitial cystitis - sees urology   Bee sting allergies, epi-pen Kidney stones H/O Seizures  Abnormal CT chest, 2014 --> f/u CT 2015 stable, no further CTs Menopausal at age 80 BTL  PLAN Here for CPX -Tdap 2017 - Shingrix , covid COVID and  flu  recommended  -- Cscope-- 11-2011, 1 small polyp, Cscope 10/2016; per GI note next cscope 2028 --Female care per  Dr Adalberto Ill, had a PAP-MMG this year.   --diet and exercise discussed. --Labs:    CMP FLP CBC A1c -- DEXA normal, 01-2019: normal; on  HRT, takes multivitamins.   --POA: Information provided Radiculopathy, neck: See LOV, saw Dr. Yevette Edwards, had a MRI, local injections, currently doing very well. Anxiety insomnia: Refill Lexapro Interstitial cystitis: Plans to see urology Mild dyslipidemia and hyperglycemia: Checking labs, if she qualifies for statins she would like to work on her lifestyle before statins are prescribed. RTC 1 year.

## 2023-04-30 NOTE — Assessment & Plan Note (Signed)
Here for CPX -Tdap 2017 - Shingrix , covid COVID and  flu  recommended  -- Cscope-- 11-2011, 1 small polyp, Cscope 10/2016; per GI note next cscope 2028 --Female care per  Dr Adalberto Ill, had a PAP-MMG this year.   --diet and exercise discussed. --Labs:    CMP FLP CBC A1c -- DEXA normal, 01-2019: normal; on  HRT, takes multivitamins.   --POA: Information provided

## 2023-04-30 NOTE — Patient Instructions (Signed)
Vaccines I recommend COVID Flu shot Shingrix       GO TO THE LAB : Get the blood work     Next visit with me in 1 year for a physical exam    Please schedule it at the front desk      "Health Care Power of attorney" ,  "Living will" (Advance care planning documents)  If you already have a living will or healthcare power of attorney, is recommended you bring the copy to be scanned in your chart.   The document will be available to all the doctors you see in the system.  Advance care planning is a process that supports adults in  understanding and sharing their preferences regarding future medical care.  The patient's preferences are recorded in documents called Advance Directives and the can be modified at any time while the patient is in full mental capacity.   If you don't have one, please consider create one.      More information at: [email or web address]

## 2023-04-30 NOTE — Assessment & Plan Note (Signed)
Here for CPX Radiculopathy, neck: See LOV, saw Dr. Yevette Edwards, had a MRI, local injections, currently doing very well. Anxiety insomnia: Refill Lexapro Interstitial cystitis: Plans to see urology Mild dyslipidemia and hyperglycemia: Checking labs, if she qualifies for statins she would like to work on her lifestyle before statins are prescribed. RTC 1 year.

## 2023-05-01 LAB — CBC WITH DIFFERENTIAL/PLATELET
Basophils Absolute: 0 10*3/uL (ref 0.0–0.1)
Basophils Relative: 0.4 % (ref 0.0–3.0)
Eosinophils Absolute: 0.2 10*3/uL (ref 0.0–0.7)
Eosinophils Relative: 1.8 % (ref 0.0–5.0)
HCT: 44.7 % (ref 36.0–46.0)
Hemoglobin: 15 g/dL (ref 12.0–15.0)
Lymphocytes Relative: 12.4 % (ref 12.0–46.0)
Lymphs Abs: 1.1 10*3/uL (ref 0.7–4.0)
MCHC: 33.5 g/dL (ref 30.0–36.0)
MCV: 92.3 fL (ref 78.0–100.0)
Monocytes Absolute: 0.7 10*3/uL (ref 0.1–1.0)
Monocytes Relative: 7.8 % (ref 3.0–12.0)
Neutro Abs: 6.9 10*3/uL (ref 1.4–7.7)
Neutrophils Relative %: 77.6 % — ABNORMAL HIGH (ref 43.0–77.0)
Platelets: 283 10*3/uL (ref 150.0–400.0)
RBC: 4.84 Mil/uL (ref 3.87–5.11)
RDW: 14.1 % (ref 11.5–15.5)
WBC: 8.8 10*3/uL (ref 4.0–10.5)

## 2023-05-01 LAB — LIPID PANEL
Cholesterol: 229 mg/dL — ABNORMAL HIGH (ref 0–200)
HDL: 45.9 mg/dL (ref >39.00)
LDL Cholesterol: 133 mg/dL — ABNORMAL HIGH (ref 0–99)
NonHDL: 182.83
Total CHOL/HDL Ratio: 5
Triglycerides: 250 mg/dL — ABNORMAL HIGH (ref 0.0–149.0)
VLDL: 50 mg/dL — ABNORMAL HIGH (ref 0.0–40.0)

## 2023-05-01 LAB — COMPREHENSIVE METABOLIC PANEL
ALT: 11 U/L (ref 0–35)
AST: 13 U/L (ref 0–37)
Albumin: 4.1 g/dL (ref 3.5–5.2)
Alkaline Phosphatase: 89 U/L (ref 39–117)
BUN: 11 mg/dL (ref 6–23)
CO2: 27 meq/L (ref 19–32)
Calcium: 9.6 mg/dL (ref 8.4–10.5)
Chloride: 104 meq/L (ref 96–112)
Creatinine, Ser: 0.79 mg/dL (ref 0.40–1.20)
GFR: 80.1 mL/min (ref >60.00)
Glucose, Bld: 146 mg/dL — ABNORMAL HIGH (ref 70–99)
Potassium: 4.4 meq/L (ref 3.5–5.1)
Sodium: 138 meq/L (ref 135–145)
Total Bilirubin: 0.5 mg/dL (ref 0.2–1.2)
Total Protein: 6.7 g/dL (ref 6.0–8.3)

## 2023-05-01 LAB — HEMOGLOBIN A1C: Hgb A1c MFr Bld: 6 % (ref 4.6–6.5)

## 2023-06-24 ENCOUNTER — Ambulatory Visit: Payer: BLUE CROSS/BLUE SHIELD | Admitting: Family

## 2023-06-24 VITALS — BP 134/78 | HR 83 | Temp 98.6°F | Ht 67.0 in | Wt 178.2 lb

## 2023-06-24 DIAGNOSIS — J209 Acute bronchitis, unspecified: Secondary | ICD-10-CM

## 2023-06-24 MED ORDER — PREDNISONE 20 MG PO TABS: 20.0000 mg | ORAL_TABLET | Freq: Every day | ORAL | 0 refills | Status: DC

## 2023-06-24 MED ORDER — AZITHROMYCIN 250 MG PO TABS: ORAL_TABLET | ORAL | 0 refills | Status: AC

## 2023-06-24 MED ORDER — HYDROCODONE BIT-HOMATROP MBR 5-1.5 MG/5ML PO SOLN: 5.0000 mL | Freq: Three times a day (TID) | ORAL | 0 refills | Status: AC | PRN

## 2023-06-24 NOTE — Progress Notes (Signed)
Annette Woods is a 63 y.o. female with the following history as recorded in EpicCare:  Patient Active Problem List   Diagnosis Date Noted   COVID-19 12/06/2022   High-tone pelvic floor dysfunction 04/10/2021   Urinary retention 04/10/2021   PCP NOTES >>>>>>>>>>>>>>>>>> 08/24/2015   Atopic dermatitis 01/06/2013   Abnormal CT scan, chest 01/06/2013   Bee sting allergy 01/06/2013   Internal hemorrhoid 05/18/2012   Annual physical exam 11/11/2011   Anxiety 09/12/2008   Menopause 09/12/2008   INSOMNIA 09/12/2008   CYSTITIS, CHRONIC INTERSTITIAL 12/17/2006   NEPHROLITHIASIS, HX OF 12/17/2006    Current Outpatient Medications  Medication Sig Dispense Refill   azithromycin (ZITHROMAX Z-PAK) 250 MG tablet Take 2 tablets (500 mg) PO today, then 1 tablet (250 mg) PO daily x4 days. 6 tablet 0   EPINEPHrine 0.3 mg/0.3 mL IJ SOAJ injection Inject 0.3 mg into the skin once as needed for up to 1 dose for anaphylaxis. 2 each 3   escitalopram (LEXAPRO) 20 MG tablet Take 1 tablet (20 mg total) by mouth daily. 90 tablet 2   estradiol (ESTRACE) 0.5 MG tablet Take 0.5 mg by mouth daily.     HYDROcodone bit-homatropine (HYCODAN) 5-1.5 MG/5ML syrup Take 5 mLs by mouth every 8 (eight) hours as needed for cough. 120 mL 0   predniSONE (DELTASONE) 20 MG tablet Take 1 tablet (20 mg total) by mouth daily with breakfast. 5 tablet 0   progesterone (PROMETRIUM) 100 MG capsule Take 100 mg by mouth daily.     No current facility-administered medications for this visit.    Allergies: Penicillins, Bee venom, Doxycycline, Macrobid [nitrofurantoin], and Pyridium [phenazopyridine]  Past Medical History:  Diagnosis Date   Abnormal CT scan, chest 01/06/2013   Anxiety    Hemorrhoids    Interstitial cystitis    Nephrolithiasis    SEIZURES, HX OF 12/17/2006   Qualifier: History of  By: Blossom Hoops MD, Luis      Past Surgical History:  Procedure Laterality Date   endometiral ablation     kidney stone extraction      06-2012   TUBAL LIGATION      Family History  Problem Relation Age of Onset   Lung cancer Father        smoker    Heart attack Father 19   Diabetes Other        GF, GM?   Breast cancer Neg Hx    Colon cancer Neg Hx    Stroke Neg Hx     Social History   Tobacco Use   Smoking status: Never   Smokeless tobacco: Never  Substance Use Topics   Alcohol use: No    Subjective:   Persistent cough/ congestion x 2-3 weeks; "can't shake the cough." Cough is keeping up at night; + coughing fits; no fever, chest pain or shortness of breath; no relief with OTC medications;   Objective:  Vitals:   06/24/23 1006  BP: 134/78  Pulse: 83  Temp: 98.6 F (37 C)  TempSrc: Oral  SpO2: 98%  Weight: 178 lb 3.2 oz (80.8 kg)  Height: 5\' 7"  (1.702 m)    General: Well developed, well nourished, in no acute distress  Skin : Warm and dry.  Head: Normocephalic and atraumatic  Eyes: Sclera and conjunctiva clear; pupils round and reactive to light; extraocular movements intact  Ears: External normal; canals clear; tympanic membranes normal  Oropharynx: Pink, supple. No suspicious lesions  Neck: Supple without thyromegaly, adenopathy  Lungs: Respirations unlabored;  clear to auscultation bilaterally without wheeze, rales, rhonchi  CVS exam: normal rate and regular rhythm.  Neurologic: Alert and oriented; speech intact; face symmetrical; moves all extremities well; CNII-XII intact without focal deficit   Assessment:  1. Acute bronchitis, unspecified organism     Plan:  Rx for Z-pak #1 take as directed; Rx for Prednisone 20 mg every day x 5 days; Rx for Hycodan cough syrup to use at night as needed; increase fluids, rest and follow up worse, no better.   No follow-ups on file.  No orders of the defined types were placed in this encounter.   Requested Prescriptions   Signed Prescriptions Disp Refills   azithromycin (ZITHROMAX Z-PAK) 250 MG tablet 6 tablet 0    Sig: Take 2 tablets (500 mg) PO  today, then 1 tablet (250 mg) PO daily x4 days.   predniSONE (DELTASONE) 20 MG tablet 5 tablet 0    Sig: Take 1 tablet (20 mg total) by mouth daily with breakfast.   HYDROcodone bit-homatropine (HYCODAN) 5-1.5 MG/5ML syrup 120 mL 0    Sig: Take 5 mLs by mouth every 8 (eight) hours as needed for cough.

## 2023-07-02 DIAGNOSIS — Z124 Encounter for screening for malignant neoplasm of cervix: Secondary | ICD-10-CM | POA: Diagnosis not present

## 2023-07-02 DIAGNOSIS — Z6828 Body mass index (BMI) 28.0-28.9, adult: Secondary | ICD-10-CM | POA: Diagnosis not present

## 2023-07-02 DIAGNOSIS — Z01419 Encounter for gynecological examination (general) (routine) without abnormal findings: Secondary | ICD-10-CM | POA: Diagnosis not present

## 2023-07-02 DIAGNOSIS — Z13820 Encounter for screening for osteoporosis: Secondary | ICD-10-CM | POA: Diagnosis not present

## 2023-07-17 DIAGNOSIS — H02831 Dermatochalasis of right upper eyelid: Secondary | ICD-10-CM | POA: Diagnosis not present

## 2023-07-17 DIAGNOSIS — H02834 Dermatochalasis of left upper eyelid: Secondary | ICD-10-CM | POA: Diagnosis not present

## 2023-07-17 DIAGNOSIS — H524 Presbyopia: Secondary | ICD-10-CM | POA: Diagnosis not present

## 2023-07-17 DIAGNOSIS — H2513 Age-related nuclear cataract, bilateral: Secondary | ICD-10-CM | POA: Diagnosis not present

## 2023-12-23 ENCOUNTER — Ambulatory Visit
Admission: EM | Admit: 2023-12-23 | Discharge: 2023-12-23 | Disposition: A | Discharge status: Home / Self Care | Attending: Family Medicine | Admitting: Family Medicine

## 2023-12-23 DIAGNOSIS — T7840XA Allergy, unspecified, initial encounter: Secondary | ICD-10-CM | POA: Diagnosis not present

## 2023-12-23 DIAGNOSIS — L503 Dermatographic urticaria: Secondary | ICD-10-CM

## 2023-12-23 MED ORDER — PREDNISONE 10 MG PO TABS: 30.0000 mg | ORAL_TABLET | Freq: Every day | ORAL | 0 refills | Status: AC

## 2023-12-23 MED ORDER — HYDROXYZINE HCL 25 MG PO TABS: 12.5000 mg | ORAL_TABLET | Freq: Three times a day (TID) | ORAL | 0 refills | Status: AC | PRN

## 2023-12-23 NOTE — Discharge Instructions (Signed)
 I would like for you to start an oral prednisone  course to address of possible allergic source, reactive rash.  Make sure you monitor and avoid any new exposures in your environment.  You can use hydroxyzine  for itching and to counteract the insomnia that can come with prednisone .  Will let you know about your lab results as soon as we can.  If you develop facial swelling, oral swelling, difficulty with your breathing then please present to the emergency room.

## 2023-12-23 NOTE — ED Provider Notes (Signed)
 [facility]  Note:  This document was prepared using Conservation officer, historic buildings and may include unintentional dictation errors.  MRN: [id number] DOB: 1961/04/27  Subjective:   Annette Woods is a 63 y.o. female presenting for 1 day history of intermittent hypopigmented streaks along her forearms.  Has had associated red patches along her face and upper chest/neck.  Felt significant itching and has avoided scratching the arms and face.  She did take some Benadryl  and thereafter the symptoms have improved.  However, she is concerned for a significant allergic reaction.  No fever, difficulty breathing, oral swelling, facial swelling, nausea, vomiting, tenderness, drainage of pus or bleeding, belly pain.  Denies eating any new foods, starting new medications, exposure to poisonous plants, new hygiene products, new cleaning products or detergents.   No current facility-administered medications for this encounter.  Current Outpatient Medications:    escitalopram  (LEXAPRO ) 20 MG tablet, Take 1 tablet (20 mg total) by mouth daily., Disp: 90 tablet, Rfl: 2   estradiol (ESTRACE) 0.5 MG tablet, Take 0.5 mg by mouth daily., Disp: , Rfl:    predniSONE  (DELTASONE ) 20 MG tablet, Take 1 tablet (20 mg total) by mouth daily with breakfast., Disp: 5 tablet, Rfl: 0   progesterone (PROMETRIUM) 100 MG capsule, Take 100 mg by mouth daily., Disp: , Rfl:    azithromycin  (ZITHROMAX  Z-PAK) 250 MG tablet, Take 2 tablets (500 mg) PO today, then 1 tablet (250 mg) PO daily x4 days., Disp: 6 tablet, Rfl: 0   EPINEPHrine  0.3 mg/0.3 mL IJ SOAJ injection, Inject 0.3 mg into the skin once as needed for up to 1 dose for anaphylaxis., Disp: 2 each, Rfl: 3   HYDROcodone  bit-homatropine (HYCODAN) 5-1.5 MG/5ML syrup, Take 5 mLs by mouth every 8 (eight) hours as needed for cough., Disp: 120 mL, Rfl: 0   Allergies  Allergen Reactions   Penicillins Anaphylaxis   Bee Venom Swelling   Doxycycline  Itching  and Other (See Comments)    Severe vaginitis and ic flare    Macrobid  [Nitrofurantoin ] Itching    See OV note 11-15-2020   Pyridium  [Phenazopyridine ] Itching    See OV note 11-15-2020    Past Medical History:  Diagnosis Date   Abnormal CT scan, chest 01/06/2013   Anxiety    Hemorrhoids    Interstitial cystitis    Nephrolithiasis    SEIZURES, HX OF 12/17/2006   Qualifier: History of  By: Tita MD, Luis       Past Surgical History:  Procedure Laterality Date   endometiral ablation     kidney stone extraction     06-2012   TUBAL LIGATION      Family History  Problem Relation Age of Onset   Lung cancer Father        smoker    Heart attack Father 86   Diabetes Other        GF, GM?   Breast cancer Neg Hx    Colon cancer Neg Hx    Stroke Neg Hx     Social History   Tobacco Use   Smoking status: Never   Smokeless tobacco: Never  Substance Use Topics   Alcohol use: No   Drug use: No    ROS   Objective:   Vitals: BP 117/81 (BP Location: Left Arm)   Pulse 71   Temp 98.5 F (36.9 C) (Oral)   Resp 18   SpO2 95%   Physical Exam Constitutional:  General: She is not in acute distress.    Appearance: Normal appearance. She is well-developed. She is not ill-appearing, toxic-appearing or diaphoretic.  HENT:     Head: Normocephalic and atraumatic.     Nose: Nose normal.     Mouth/Throat:     Mouth: Mucous membranes are moist.     Pharynx: No pharyngeal swelling, oropharyngeal exudate, posterior oropharyngeal erythema or uvula swelling.     Tonsils: No tonsillar exudate or tonsillar abscesses. 0 on the right. 0 on the left.  Eyes:     General: No scleral icterus.       Right eye: No discharge.        Left eye: No discharge.     Extraocular Movements: Extraocular movements intact.  Cardiovascular:     Rate and Rhythm: Normal rate and regular rhythm.     Heart sounds: Normal heart sounds. No murmur heard.    No friction rub. No gallop.  Pulmonary:      Effort: Pulmonary effort is normal. No respiratory distress.     Breath sounds: No stridor. No wheezing, rhonchi or rales.  Chest:     Chest wall: No tenderness.  Skin:    General: Skin is warm and dry.     Findings: Rash (trace dermatographia of the right forearm) present.  Neurological:     General: No focal deficit present.     Mental Status: She is alert and oriented to person, place, and time.  Psychiatric:        Mood and Affect: Mood normal.        Behavior: Behavior normal.       Assessment and Plan :   PDMP not reviewed this encounter.  1. Dermatographic urticaria   2. Allergic rash present on examination    Discussed differential and advised that there is a possible allergic component to her dermatographic urticaria.  Patient does have a history of reactions, atopic dermatitis.  Offered oral prednisone  30 mg given the mild nature of her dermatographia.  Hydroxyzine  for itching.  Monitor for any eliminate new exposures.  Follow-up with PCP ASAP. Counseled patient on potential for adverse effects with medications prescribed/recommended today, ER and return-to-clinic precautions discussed, patient verbalized understanding.    Christopher Savannah, [place] 12/23/23 1327

## 2023-12-23 NOTE — ED Triage Notes (Signed)
 Patient presents to the urgent care for white streaks on her right arm that started today. Patient reports allergic reaction on her face and neck.  Patient states she has not use any new products or new foods.

## 2023-12-24 ENCOUNTER — Ambulatory Visit (HOSPITAL_COMMUNITY): Payer: Self-pay

## 2023-12-24 LAB — CBC WITH DIFFERENTIAL/PLATELET
Basophils Absolute: 0.1 x10E3/uL (ref 0.0–0.2)
Basos: 1 %
EOS (ABSOLUTE): 0.2 x10E3/uL (ref 0.0–0.4)
Eos: 3 %
Hematocrit: 44.9 % (ref 34.0–46.6)
Hemoglobin: 14.8 g/dL (ref 11.1–15.9)
Immature Grans (Abs): 0 x10E3/uL (ref 0.0–0.1)
Immature Granulocytes: 0 %
Lymphocytes Absolute: 1.3 x10E3/uL (ref 0.7–3.1)
Lymphs: 19 %
MCH: 30.5 pg (ref 26.6–33.0)
MCHC: 33 g/dL (ref 31.5–35.7)
MCV: 92 fL (ref 79–97)
Monocytes Absolute: 0.5 x10E3/uL (ref 0.1–0.9)
Monocytes: 7 %
Neutrophils Absolute: 4.8 x10E3/uL (ref 1.4–7.0)
Neutrophils: 70 %
Platelets: 273 x10E3/uL (ref 150–450)
RBC: 4.86 x10E6/uL (ref 3.77–5.28)
RDW: 13.1 % (ref 11.7–15.4)
WBC: 6.8 x10E3/uL (ref 3.4–10.8)

## 2023-12-24 LAB — COMPREHENSIVE METABOLIC PANEL WITH GFR
ALT: 11 IU/L (ref 0–32)
AST: 17 IU/L (ref 0–40)
Albumin: 3.8 g/dL — ABNORMAL LOW (ref 3.9–4.9)
Alkaline Phosphatase: 108 IU/L (ref 44–121)
BUN/Creatinine Ratio: 12 (ref 12–28)
BUN: 10 mg/dL (ref 8–27)
Bilirubin Total: 0.3 mg/dL (ref 0.0–1.2)
CO2: 21 mmol/L (ref 20–29)
Calcium: 9.2 mg/dL (ref 8.7–10.3)
Chloride: 104 mmol/L (ref 96–106)
Creatinine, Ser: 0.82 mg/dL (ref 0.57–1.00)
Globulin, Total: 2.7 g/dL (ref 1.5–4.5)
Glucose: 100 mg/dL — ABNORMAL HIGH (ref 70–99)
Potassium: 4.1 mmol/L (ref 3.5–5.2)
Sodium: 138 mmol/L (ref 134–144)
Total Protein: 6.5 g/dL (ref 6.0–8.5)
eGFR: 80 mL/min/1.73 (ref >59)

## 2024-03-18 ENCOUNTER — Other Ambulatory Visit: Payer: Self-pay | Admitting: Internal Medicine

## 2024-05-03 ENCOUNTER — Encounter: Payer: BLUE CROSS/BLUE SHIELD | Admitting: Internal Medicine

## 2024-08-03 ENCOUNTER — Encounter: Admitting: Internal Medicine

## 2024-08-09 ENCOUNTER — Encounter: Admitting: Internal Medicine
